# Patient Record
Sex: Female | Born: 1965 | Race: White | Hispanic: No | Marital: Married | Smoking: Never smoker
Health system: Southern US, Community
[De-identification: ages and names within clinical notes are randomized; demographics above are authoritative.]

## PROBLEM LIST (undated history)

## (undated) DIAGNOSIS — E119 Type 2 diabetes mellitus without complications: Secondary | ICD-10-CM

## (undated) DIAGNOSIS — R110 Nausea: Secondary | ICD-10-CM

## (undated) DIAGNOSIS — T7840XA Allergy, unspecified, initial encounter: Secondary | ICD-10-CM

## (undated) DIAGNOSIS — E785 Hyperlipidemia, unspecified: Secondary | ICD-10-CM

## (undated) HISTORY — PX: TONSILLECTOMY AND ADENOIDECTOMY: SUR1326

## (undated) HISTORY — DX: Nausea: R11.0

## (undated) HISTORY — DX: Allergy, unspecified, initial encounter: T78.40XA

## (undated) HISTORY — DX: Hyperlipidemia, unspecified: E78.5

## (undated) HISTORY — DX: Type 2 diabetes mellitus without complications: E11.9

---
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
## 1998-04-22 ENCOUNTER — Encounter: Admission: RE | Admit: 1998-04-22 | Discharge: 1998-07-21 | Payer: Self-pay | Admitting: Internal Medicine

## 1999-03-23 ENCOUNTER — Other Ambulatory Visit: Admission: RE | Admit: 1999-03-23 | Discharge: 1999-03-23 | Payer: Self-pay | Admitting: Gynecology

## 2000-01-18 ENCOUNTER — Encounter: Admission: RE | Admit: 2000-01-18 | Discharge: 2000-04-17 | Payer: Self-pay | Admitting: Internal Medicine

## 2000-03-01 ENCOUNTER — Encounter: Payer: Self-pay | Admitting: Emergency Medicine

## 2000-03-01 ENCOUNTER — Inpatient Hospital Stay (HOSPITAL_COMMUNITY): Admission: EM | Admit: 2000-03-01 | Discharge: 2000-03-02 | Payer: Self-pay

## 2000-03-01 ENCOUNTER — Encounter (INDEPENDENT_AMBULATORY_CARE_PROVIDER_SITE_OTHER): Payer: Self-pay | Admitting: *Deleted

## 2000-03-22 ENCOUNTER — Other Ambulatory Visit: Admission: RE | Admit: 2000-03-22 | Discharge: 2000-03-22 | Payer: Self-pay | Admitting: Gynecology

## 2001-03-23 ENCOUNTER — Other Ambulatory Visit: Admission: RE | Admit: 2001-03-23 | Discharge: 2001-03-23 | Payer: Self-pay | Admitting: Gynecology

## 2001-06-15 ENCOUNTER — Encounter: Payer: Self-pay | Admitting: Endocrinology

## 2001-06-15 ENCOUNTER — Encounter (INDEPENDENT_AMBULATORY_CARE_PROVIDER_SITE_OTHER): Payer: Self-pay | Admitting: *Deleted

## 2001-06-15 ENCOUNTER — Ambulatory Visit (HOSPITAL_COMMUNITY): Admission: RE | Admit: 2001-06-15 | Discharge: 2001-06-15 | Payer: Self-pay | Admitting: Endocrinology

## 2002-04-12 ENCOUNTER — Other Ambulatory Visit: Admission: RE | Admit: 2002-04-12 | Discharge: 2002-04-12 | Payer: Self-pay | Admitting: Gynecology

## 2003-04-17 ENCOUNTER — Other Ambulatory Visit: Admission: RE | Admit: 2003-04-17 | Discharge: 2003-04-17 | Payer: Self-pay | Admitting: Gynecology

## 2004-04-20 ENCOUNTER — Other Ambulatory Visit: Admission: RE | Admit: 2004-04-20 | Discharge: 2004-04-20 | Payer: Self-pay | Admitting: Gynecology

## 2004-05-19 ENCOUNTER — Encounter: Admission: RE | Admit: 2004-05-19 | Discharge: 2004-05-19 | Payer: Self-pay | Admitting: Gynecology

## 2004-08-05 ENCOUNTER — Emergency Department (HOSPITAL_COMMUNITY): Admission: EM | Admit: 2004-08-05 | Discharge: 2004-08-05 | Payer: Self-pay | Admitting: Emergency Medicine

## 2004-08-05 ENCOUNTER — Encounter (INDEPENDENT_AMBULATORY_CARE_PROVIDER_SITE_OTHER): Payer: Self-pay | Admitting: *Deleted

## 2005-04-27 ENCOUNTER — Other Ambulatory Visit: Admission: RE | Admit: 2005-04-27 | Discharge: 2005-04-27 | Payer: Self-pay | Admitting: Gynecology

## 2005-05-20 ENCOUNTER — Encounter: Admission: RE | Admit: 2005-05-20 | Discharge: 2005-05-20 | Payer: Self-pay | Admitting: Gynecology

## 2005-06-02 ENCOUNTER — Encounter (INDEPENDENT_AMBULATORY_CARE_PROVIDER_SITE_OTHER): Payer: Self-pay | Admitting: Specialist

## 2005-06-02 ENCOUNTER — Encounter: Admission: RE | Admit: 2005-06-02 | Discharge: 2005-06-02 | Payer: Self-pay | Admitting: Gynecology

## 2005-06-29 ENCOUNTER — Emergency Department (HOSPITAL_COMMUNITY): Admission: EM | Admit: 2005-06-29 | Discharge: 2005-06-29 | Payer: Self-pay | Admitting: Emergency Medicine

## 2005-09-30 ENCOUNTER — Ambulatory Visit: Payer: Self-pay | Admitting: Internal Medicine

## 2005-10-11 ENCOUNTER — Ambulatory Visit: Payer: Self-pay | Admitting: Gastroenterology

## 2005-11-01 ENCOUNTER — Ambulatory Visit: Payer: Self-pay | Admitting: Internal Medicine

## 2005-12-06 ENCOUNTER — Ambulatory Visit: Payer: Self-pay | Admitting: Internal Medicine

## 2006-05-02 ENCOUNTER — Other Ambulatory Visit: Admission: RE | Admit: 2006-05-02 | Discharge: 2006-05-02 | Payer: Self-pay | Admitting: Gynecology

## 2006-05-23 ENCOUNTER — Encounter: Admission: RE | Admit: 2006-05-23 | Discharge: 2006-05-23 | Payer: Self-pay | Admitting: Gynecology

## 2007-05-26 ENCOUNTER — Encounter: Admission: RE | Admit: 2007-05-26 | Discharge: 2007-05-26 | Payer: Self-pay | Admitting: Gynecology

## 2007-05-26 IMAGING — MG MM SCREEN MAMMOGRAM BILATERAL
5 series · 5 of 5 positions shown · non-contrast
Comparison: none

DG SCREEN MAMMOGRAM BILATERAL
Bilateral CC and MLO view(s) were taken.
Prior study comparison: [DATE], dG screen mammogram bilateral.

DIGITAL SCREENING MAMMOGRAM WITH CAD:
There are scattered fibroglandular densities.  No masses or malignant type calcifications are 
identified.  Compared with prior studies.

[R CC]
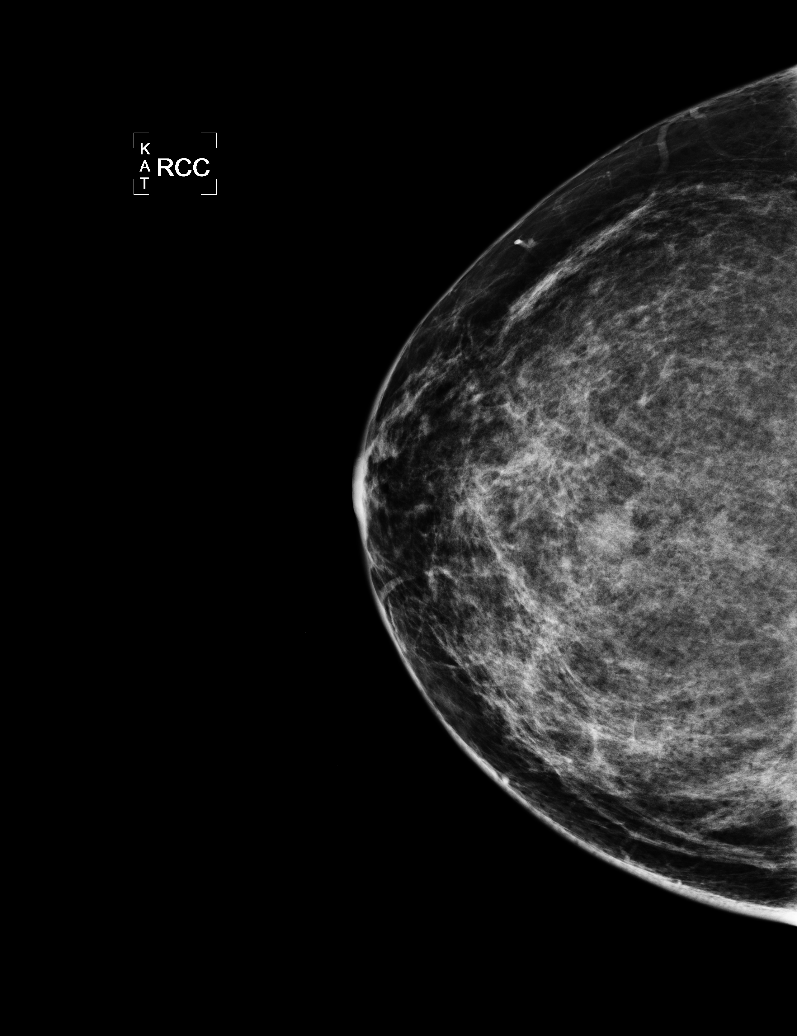

[L CC]
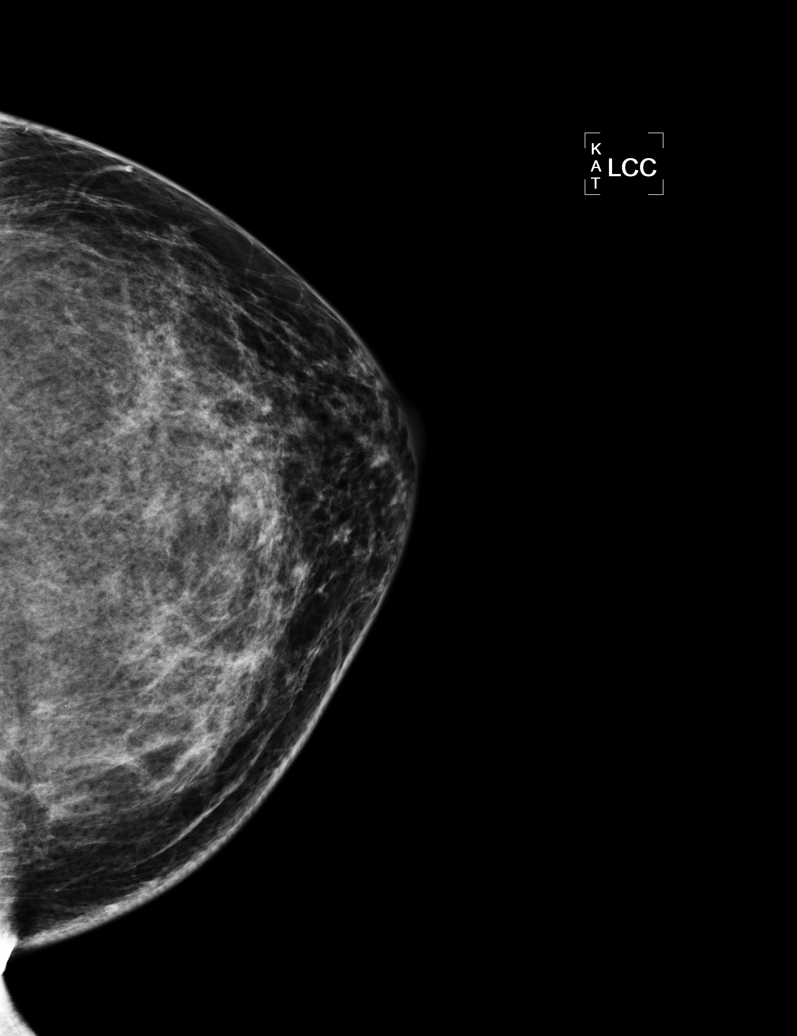

[L MLO]
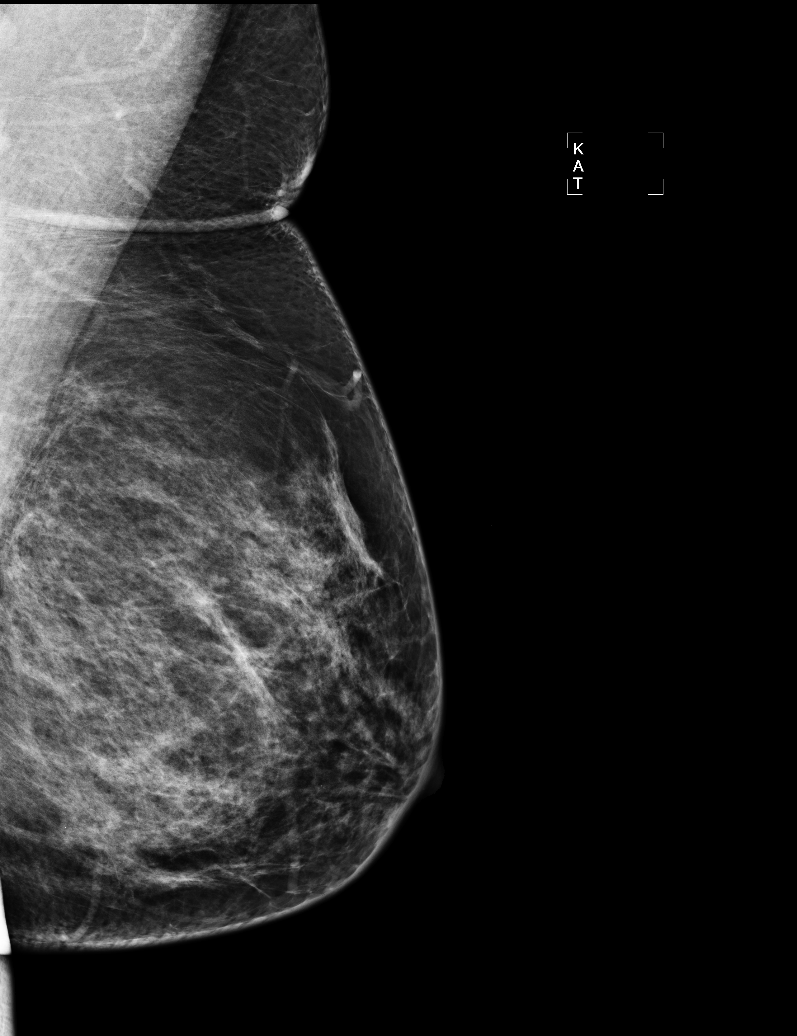

[R MLO (1 of 2)]
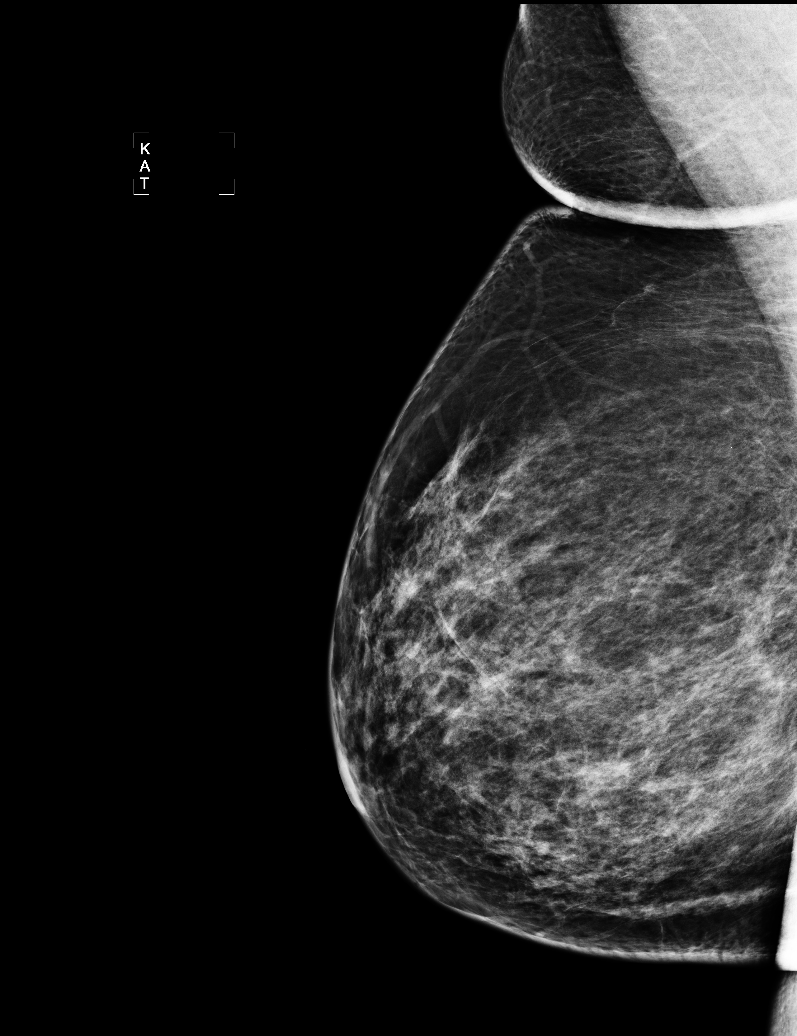

[R MLO (2 of 2)]
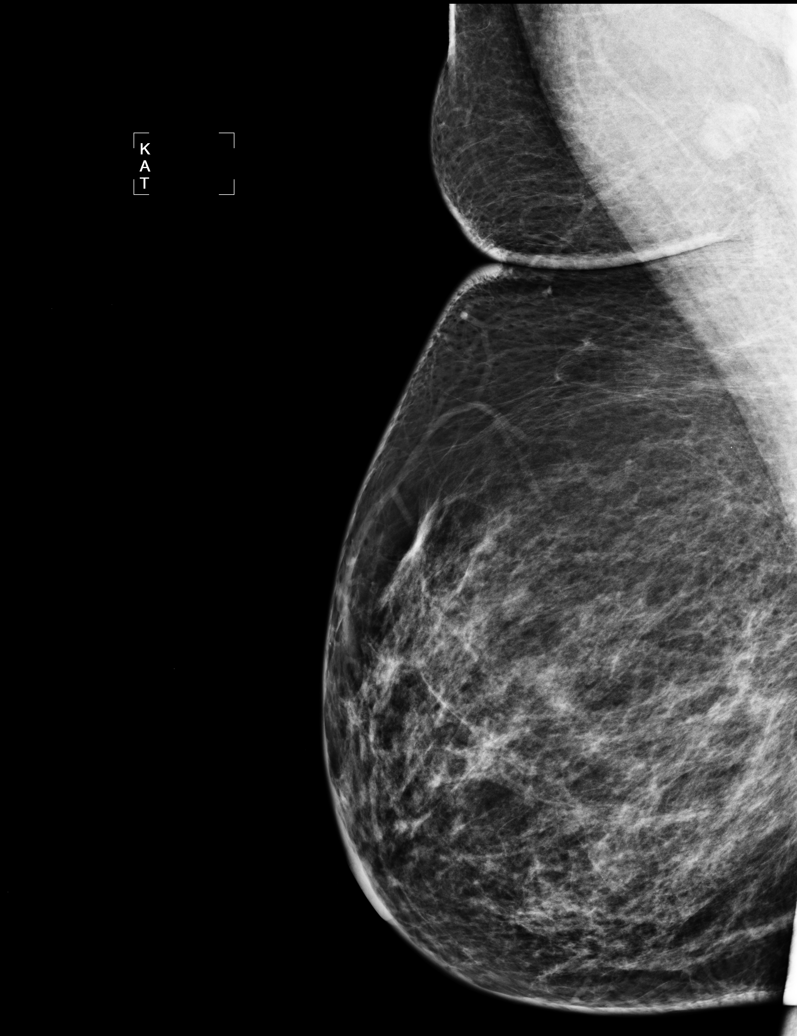

[5 of 5 positions shown; findings below may reference images not displayed]

IMPRESSION: No specific mammographic evidence of malignancy.  Next screening mammogram is recommended in one 
year.

ASSESSMENT: Negative - BI-RADS 1

Screening mammogram in 1 year.
ANALYZED BY COMPUTER AIDED DETECTION. , THIS PROCEDURE WAS A DIGITAL MAMMOGRAM.

## 2008-05-27 ENCOUNTER — Encounter: Admission: RE | Admit: 2008-05-27 | Discharge: 2008-05-27 | Payer: Self-pay | Admitting: Gynecology

## 2008-06-03 ENCOUNTER — Encounter: Admission: RE | Admit: 2008-06-03 | Discharge: 2008-06-03 | Payer: Self-pay | Admitting: Gynecology

## 2008-09-04 DIAGNOSIS — I10 Essential (primary) hypertension: Secondary | ICD-10-CM | POA: Insufficient documentation

## 2008-09-04 DIAGNOSIS — Z862 Personal history of diseases of the blood and blood-forming organs and certain disorders involving the immune mechanism: Secondary | ICD-10-CM

## 2008-09-04 DIAGNOSIS — E109 Type 1 diabetes mellitus without complications: Secondary | ICD-10-CM | POA: Insufficient documentation

## 2008-09-04 DIAGNOSIS — Z8639 Personal history of other endocrine, nutritional and metabolic disease: Secondary | ICD-10-CM

## 2008-09-04 DIAGNOSIS — E785 Hyperlipidemia, unspecified: Secondary | ICD-10-CM

## 2008-09-05 ENCOUNTER — Ambulatory Visit: Payer: Self-pay | Admitting: Internal Medicine

## 2008-09-05 DIAGNOSIS — R1013 Epigastric pain: Secondary | ICD-10-CM

## 2008-09-05 DIAGNOSIS — R740 Nonspecific elevation of levels of transaminase and lactic acid dehydrogenase [LDH]: Secondary | ICD-10-CM

## 2008-09-09 ENCOUNTER — Ambulatory Visit (HOSPITAL_COMMUNITY): Admission: RE | Admit: 2008-09-09 | Discharge: 2008-09-09 | Payer: Self-pay | Admitting: Internal Medicine

## 2009-05-28 ENCOUNTER — Encounter: Admission: RE | Admit: 2009-05-28 | Discharge: 2009-05-28 | Payer: Self-pay | Admitting: Gynecology

## 2010-06-01 ENCOUNTER — Encounter: Admission: RE | Admit: 2010-06-01 | Discharge: 2010-06-01 | Payer: Self-pay | Admitting: Gynecology

## 2010-07-25 ENCOUNTER — Encounter: Payer: Self-pay | Admitting: Gynecology

## 2010-09-15 ENCOUNTER — Other Ambulatory Visit: Payer: Self-pay | Admitting: Dermatology

## 2010-11-20 NOTE — Consult Note (Signed)
[facility]  Patient:    Erin Banks, Erin Banks                        MRN: [id number] Proc. Date: 03/01/00 Adm. Date:  [id number] Attending:  Meredith Leeds                          Consultation Report  CHIEF COMPLAINT:  Abdominal pain.  HISTORY OF PRESENT ILLNESS:  Erin Banks is a 45 year old female with type 1 diabetes mellitus, who developed right upper quadrant pain at approximately 8 oclock on February 29, 2000.  This was sharp and somewhat pleuritic in nature.  The pain was persistent but then had waves of increased severity of 5/10 in intensity.  The pain did not radiate.  She did have nausea without vomiting but denied any diarrhea; furthermore, she felt chilled and an oral temperature was taken and it was 99 degrees.  The pain persisted for several hours and the patient was instructed by me to present to the emergency department for further evaluation.  In the emergency department, she was found to have a white blood count of 18,000 with a left shift and right upper quadrant tenderness, as well as some mild elevation of her AST and ALT. Subsequently, an ultrasound was performed which did not show any definite pathology except some mild fatty liver changes and contracted gallbladder. She was subsequently admitted per general surgery for further evaluation for possible acute cholecystitis versus other abdominal process.  PAST MEDICAL HISTORY 1. Diabetes mellitus, type 1, since age 61. 2. Hypercholesterolemia. 3. G1, P1 parity status, status post C-section six years ago.  ALLERGIES:  PENICILLIN and SULFA DRUGS.  MEDICATIONS 1. Welchol 625 mg p.o. b.i.d. 2. Insulin pump, which is Humalog insulin with a carbohydrate correction    factor of 1 unit of Humalog per 10 g of carbohydrate and a correction of    blood sugar divided by 33 before each meal.  No other medications.  SOCIAL HISTORY:  Patient lives in [place].  She has been married for  11 years.  She denies tobacco or alcohol use.  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  FAMILY HISTORY:  Father is alive with diabetes.  Mother is alive and well. She has two sisters who are healthy.  REVIEW OF SYSTEMS:  As per the history of present illness; furthermore, the patient denies any dysuria, hematuria, hematochezia, melena or any cough or cold symptoms.  Her pump site has been fine and she denies any chest pains or shortness of breath.  PHYSICAL EXAMINATION  VITAL SIGNS:  Patient has been afebrile, temperature 97.9.  Pulse 89.  Blood pressure 109/67.  Blood sugars have been 246 and 142.  GENERAL:  Patient is lying supine in no acute distress, age appropriate, normal respirations.  HEENT:  Pupils are equally round and reactive to light.  There is no icterus. Oropharynx is clear.  NECK:  No JVD is noted.  Carotids are 2+ in intensity with no bruit.  LUNGS:  Clear to auscultation bilaterally with no wheezes, rales, or rhonchi.  HEART:  Regular with no murmur, rub or gallop and a normal S1 and S2.  ABDOMEN:  Soft, mildly obese, positive bowel sounds, nondistended.  There is no hepatosplenomegaly noted.  There is tenderness in the epigastric area and in the right lower quadrant.  There is no Murphys sign and there is  no definite convincing peritoneal signs, although the patient may have some slight rebound tenderness in the right lower quadrant.  She does not have any guarding.  Insulin pump site shows no signs of infection.  EXTREMITIES:  No clubbing, cyanosis, or edema.  NEUROLOGIC:  Alert and oriented x 4.  Appropriate.  Moves extremities x 4 with normal strength.  LABORATORY DATA AND IMAGING STUDIES:  White blood count is currently 9.6 with normal differential, hemoglobin 13.5, platelet count 230,000.  Urinalysis was essentially negative except for increased glucose.  Sodium is 139, potassium 4.3, chloride 106, CO2 26, BUN  6, creatinine 0.8, glucose 198.  LFTs are normal with the exception of an AST of 45 and an ALT of 71, which are mildly elevated.  A hepatobiliary scan showed normal gallbladder function with no evidence of cholecystitis and furthermore, a CT of the abdomen and pelvis is negative this afternoon.  Urine hCG is negative and amylase and lipase are both normal.  ASSESSMENT AND PLAN:  [age]-year-old female with type 1 diabetes and hypercholesterolemia with acute abdominal pain syndrome, with associated increased white blood count and mild elevation of liver function tests, with imaging studies only confirming some mild fatty liver changes.  1. Abdominal pain:  I appreciate surgical management of this patient.  I would    favor not restarting the patients Welchol currently.  Although this may    not be significantly likely to have caused any of the patients problems,    it would be reasonable to check her fasting lipid profile, as Welchol can    sometimes cause an increase in triglycerides which could relate to some    abdominal pathology, although the amylase and lipase are normal.  We will    also check a thyroid-stimulating hormone in the morning.  Will continue    Cipro currently, as the patient is seeming to have some clinical response    despite the unknown etiology of her pain. 2. Diabetes mellitus, type 1:  Will continue insulin pump at current doses.    If the patient tolerates her oral meal today, we will discontinue her D-5    half normal saline drip.  Will continue to follow the patient. DD:  03/01/00 TD:  03/02/00 Job: [id number]

## 2010-11-20 NOTE — H&P (Signed)
[facility]  Patient:    Erin Banks, Erin Banks                       MRN: [id number] Adm. Date:  03/01/00 Attending:  Zigmund Daniel, M.D.                         History and Physical  CHIEF COMPLAINT:  Abdominal pain.  HISTORY OF PRESENT ILLNESS:  The patient is a 45 year old white female who is a long-time diabetic on an insulin pump, who very shortly after dinner this evening began to experience epigastric pain.  It did not radiate.  It is a very uncomfortable, knife-like sensation.  It hurts to move around some as well.  She was nauseated and felt full but has not vomited.  It did not radiate to the back or to the right flank.  There has been no movement of the pain.  She came to the emergency department and was there found to have some tenderness in the epigastrium and white blood cell count elevated at 18,000 and normal hemoglobin, urinalysis unremarkable except for glucosuria, and chemistries unremarkable except for slight elevation of the SGOT and SGPT and elevation of her glucose.  She underwent an ultrasound of the abdomen, which showed a thickened gallbladder, but it is a contracted gallbladder and no stones were seen.  No fluid was seen around the gallbladder.  She was noted to have a fatty liver.  Pancreas and aorta were not well seen.  The patient is admitted for supportive care, further diagnostic workup, and possible cholecystectomy.  PAST MEDICAL HISTORY:  She is a diabetic, as mentioned.  She does not smoke or drink.  She has hyperlipidemia, on a lipid-reducing medicine which she could not name for me.  She is on an insulin pump, where she gets about 32 units of insulin a day with boluses before her meals.  She has not had any problems with her eyes, with her kidneys, or with heart or lungs to this point.  She has had a C-section and tonsillectomy.  She denies pregnancy.  ALLERGIES:  PENICILLIN and SULFA DRUGS.  FAMILY HISTORY:   Childhood illnesses, unremarkable.  REVIEW OF SYSTEMS:  Beyond the above, unremarkable.  PHYSICAL EXAMINATION:  VITAL SIGNS:  Temperature and vital signs per nursing report.  GENERAL:  Mental status is normal.  No acute distress.  HEENT:  Head, neck, eyes, ears, nose, mouth, and throat unremarkable.  CHEST:  Clear to auscultation.  BREASTS:  Normal.  HEART:  Heart rate a little rapid, rhythm normal, no murmur or gallop.  ABDOMEN:  Moderate tenderness in the epigastrium and just to the right of the midline.  No mass.  Bowel sounds are normal.  No rebound tenderness detected.  GENITAL/RECTAL:  Not performed.  EXTREMITIES:  Normal.  Pulses normal.  LYMPHATIC:  Lymph nodes not enlarged.  SKIN:  Normal.  IMPRESSION:  Acute abdominal process, probable acute cholecystitis, rule out gastritis or acute infection other than the above.  PLAN:  Admission for IV fluids, pain medicine, observation, further workup.  I will get a hepatobiliary scan, and I will get a medical consultation.  I will check the patient in a few hours. DD:  03/01/00 TD:  03/01/00 Job: [id number]

## 2011-04-28 ENCOUNTER — Other Ambulatory Visit: Payer: Self-pay | Admitting: Gynecology

## 2011-04-28 DIAGNOSIS — Z1231 Encounter for screening mammogram for malignant neoplasm of breast: Secondary | ICD-10-CM

## 2011-06-03 ENCOUNTER — Ambulatory Visit
Admission: RE | Admit: 2011-06-03 | Discharge: 2011-06-03 | Disposition: A | Discharge status: Home / Self Care | Payer: 59 | Source: Ambulatory Visit | Attending: Gynecology | Admitting: Gynecology

## 2011-06-03 DIAGNOSIS — Z1231 Encounter for screening mammogram for malignant neoplasm of breast: Secondary | ICD-10-CM

## 2011-06-15 ENCOUNTER — Other Ambulatory Visit: Payer: Self-pay | Admitting: Gynecology

## 2012-04-24 ENCOUNTER — Other Ambulatory Visit: Payer: Self-pay | Admitting: Gynecology

## 2012-04-24 DIAGNOSIS — Z1231 Encounter for screening mammogram for malignant neoplasm of breast: Secondary | ICD-10-CM

## 2012-06-05 ENCOUNTER — Ambulatory Visit
Admission: RE | Admit: 2012-06-05 | Discharge: 2012-06-05 | Disposition: A | Discharge status: Home / Self Care | Payer: 59 | Source: Ambulatory Visit | Attending: Gynecology | Admitting: Gynecology

## 2012-06-05 DIAGNOSIS — Z1231 Encounter for screening mammogram for malignant neoplasm of breast: Secondary | ICD-10-CM

## 2012-06-19 ENCOUNTER — Other Ambulatory Visit: Payer: Self-pay | Admitting: Gynecology

## 2013-05-08 ENCOUNTER — Other Ambulatory Visit: Payer: Self-pay

## 2013-05-08 DIAGNOSIS — Z1231 Encounter for screening mammogram for malignant neoplasm of breast: Secondary | ICD-10-CM

## 2013-06-07 ENCOUNTER — Ambulatory Visit
Admission: RE | Admit: 2013-06-07 | Discharge: 2013-06-07 | Disposition: A | Discharge status: Home / Self Care | Payer: 59 | Source: Ambulatory Visit

## 2013-06-07 DIAGNOSIS — Z1231 Encounter for screening mammogram for malignant neoplasm of breast: Secondary | ICD-10-CM

## 2014-05-10 ENCOUNTER — Other Ambulatory Visit: Payer: Self-pay

## 2014-05-10 DIAGNOSIS — Z1231 Encounter for screening mammogram for malignant neoplasm of breast: Secondary | ICD-10-CM

## 2014-06-11 ENCOUNTER — Encounter (INDEPENDENT_AMBULATORY_CARE_PROVIDER_SITE_OTHER): Payer: Self-pay

## 2014-06-11 ENCOUNTER — Ambulatory Visit
Admission: RE | Admit: 2014-06-11 | Discharge: 2014-06-11 | Disposition: A | Discharge status: Home / Self Care | Payer: 59 | Source: Ambulatory Visit

## 2014-06-11 DIAGNOSIS — Z1231 Encounter for screening mammogram for malignant neoplasm of breast: Secondary | ICD-10-CM

## 2015-05-07 ENCOUNTER — Other Ambulatory Visit: Payer: Self-pay

## 2015-05-07 DIAGNOSIS — Z1231 Encounter for screening mammogram for malignant neoplasm of breast: Secondary | ICD-10-CM

## 2015-06-16 ENCOUNTER — Ambulatory Visit
Admission: RE | Admit: 2015-06-16 | Discharge: 2015-06-16 | Disposition: A | Discharge status: Home / Self Care | Payer: 59 | Source: Ambulatory Visit

## 2015-06-16 DIAGNOSIS — Z1231 Encounter for screening mammogram for malignant neoplasm of breast: Secondary | ICD-10-CM

## 2015-06-18 ENCOUNTER — Other Ambulatory Visit: Payer: Self-pay | Admitting: Gynecology

## 2015-06-18 DIAGNOSIS — R928 Other abnormal and inconclusive findings on diagnostic imaging of breast: Secondary | ICD-10-CM

## 2015-06-24 ENCOUNTER — Ambulatory Visit
Admission: RE | Admit: 2015-06-24 | Discharge: 2015-06-24 | Disposition: A | Discharge status: Home / Self Care | Payer: 59 | Source: Ambulatory Visit | Attending: Gynecology | Admitting: Gynecology

## 2015-06-24 DIAGNOSIS — R928 Other abnormal and inconclusive findings on diagnostic imaging of breast: Secondary | ICD-10-CM

## 2016-05-18 ENCOUNTER — Other Ambulatory Visit: Payer: Self-pay | Admitting: Endocrinology

## 2016-05-18 DIAGNOSIS — Z1231 Encounter for screening mammogram for malignant neoplasm of breast: Secondary | ICD-10-CM

## 2016-07-02 ENCOUNTER — Ambulatory Visit
Admission: RE | Admit: 2016-07-02 | Discharge: 2016-07-02 | Disposition: A | Discharge status: Home / Self Care | Payer: 59 | Source: Ambulatory Visit | Attending: Endocrinology | Admitting: Endocrinology

## 2016-07-02 DIAGNOSIS — Z1231 Encounter for screening mammogram for malignant neoplasm of breast: Secondary | ICD-10-CM

## 2016-07-11 DIAGNOSIS — Z23 Encounter for immunization: Secondary | ICD-10-CM | POA: Diagnosis not present

## 2016-07-15 DIAGNOSIS — Z124 Encounter for screening for malignant neoplasm of cervix: Secondary | ICD-10-CM | POA: Diagnosis not present

## 2016-07-15 DIAGNOSIS — Z01419 Encounter for gynecological examination (general) (routine) without abnormal findings: Secondary | ICD-10-CM | POA: Diagnosis not present

## 2016-07-22 DIAGNOSIS — E109 Type 1 diabetes mellitus without complications: Secondary | ICD-10-CM | POA: Diagnosis not present

## 2016-08-06 DIAGNOSIS — I10 Essential (primary) hypertension: Secondary | ICD-10-CM | POA: Diagnosis not present

## 2016-08-06 DIAGNOSIS — Z4681 Encounter for fitting and adjustment of insulin pump: Secondary | ICD-10-CM | POA: Diagnosis not present

## 2016-08-06 DIAGNOSIS — E1065 Type 1 diabetes mellitus with hyperglycemia: Secondary | ICD-10-CM | POA: Diagnosis not present

## 2016-08-11 DIAGNOSIS — L570 Actinic keratosis: Secondary | ICD-10-CM | POA: Diagnosis not present

## 2016-08-11 DIAGNOSIS — D692 Other nonthrombocytopenic purpura: Secondary | ICD-10-CM | POA: Diagnosis not present

## 2016-08-11 DIAGNOSIS — Z85828 Personal history of other malignant neoplasm of skin: Secondary | ICD-10-CM | POA: Diagnosis not present

## 2016-08-11 DIAGNOSIS — D224 Melanocytic nevi of scalp and neck: Secondary | ICD-10-CM | POA: Diagnosis not present

## 2016-09-21 DIAGNOSIS — Z4681 Encounter for fitting and adjustment of insulin pump: Secondary | ICD-10-CM | POA: Diagnosis not present

## 2016-09-21 DIAGNOSIS — I10 Essential (primary) hypertension: Secondary | ICD-10-CM | POA: Diagnosis not present

## 2016-09-21 DIAGNOSIS — E1065 Type 1 diabetes mellitus with hyperglycemia: Secondary | ICD-10-CM | POA: Diagnosis not present

## 2016-10-13 DIAGNOSIS — E109 Type 1 diabetes mellitus without complications: Secondary | ICD-10-CM | POA: Diagnosis not present

## 2016-10-19 DIAGNOSIS — R740 Nonspecific elevation of levels of transaminase and lactic acid dehydrogenase [LDH]: Secondary | ICD-10-CM | POA: Diagnosis not present

## 2016-10-19 DIAGNOSIS — E784 Other hyperlipidemia: Secondary | ICD-10-CM | POA: Diagnosis not present

## 2016-10-19 DIAGNOSIS — E10319 Type 1 diabetes mellitus with unspecified diabetic retinopathy without macular edema: Secondary | ICD-10-CM | POA: Diagnosis not present

## 2016-11-25 DIAGNOSIS — L814 Other melanin hyperpigmentation: Secondary | ICD-10-CM | POA: Diagnosis not present

## 2016-11-25 DIAGNOSIS — L820 Inflamed seborrheic keratosis: Secondary | ICD-10-CM | POA: Diagnosis not present

## 2016-12-08 DIAGNOSIS — E10319 Type 1 diabetes mellitus with unspecified diabetic retinopathy without macular edema: Secondary | ICD-10-CM | POA: Diagnosis not present

## 2016-12-08 DIAGNOSIS — I10 Essential (primary) hypertension: Secondary | ICD-10-CM | POA: Diagnosis not present

## 2016-12-08 DIAGNOSIS — Z4681 Encounter for fitting and adjustment of insulin pump: Secondary | ICD-10-CM | POA: Diagnosis not present

## 2017-01-03 DIAGNOSIS — E109 Type 1 diabetes mellitus without complications: Secondary | ICD-10-CM | POA: Diagnosis not present

## 2017-02-22 DIAGNOSIS — R740 Nonspecific elevation of levels of transaminase and lactic acid dehydrogenase [LDH]: Secondary | ICD-10-CM | POA: Diagnosis not present

## 2017-02-22 DIAGNOSIS — E10319 Type 1 diabetes mellitus with unspecified diabetic retinopathy without macular edema: Secondary | ICD-10-CM | POA: Diagnosis not present

## 2017-02-22 DIAGNOSIS — E11319 Type 2 diabetes mellitus with unspecified diabetic retinopathy without macular edema: Secondary | ICD-10-CM | POA: Diagnosis not present

## 2017-03-28 DIAGNOSIS — E109 Type 1 diabetes mellitus without complications: Secondary | ICD-10-CM | POA: Diagnosis not present

## 2017-05-11 DIAGNOSIS — Z4681 Encounter for fitting and adjustment of insulin pump: Secondary | ICD-10-CM | POA: Diagnosis not present

## 2017-05-11 DIAGNOSIS — E10319 Type 1 diabetes mellitus with unspecified diabetic retinopathy without macular edema: Secondary | ICD-10-CM | POA: Diagnosis not present

## 2017-05-11 DIAGNOSIS — I10 Essential (primary) hypertension: Secondary | ICD-10-CM | POA: Diagnosis not present

## 2017-05-25 ENCOUNTER — Other Ambulatory Visit: Payer: Self-pay | Admitting: Endocrinology

## 2017-05-25 DIAGNOSIS — Z1231 Encounter for screening mammogram for malignant neoplasm of breast: Secondary | ICD-10-CM

## 2017-06-22 DIAGNOSIS — E109 Type 1 diabetes mellitus without complications: Secondary | ICD-10-CM | POA: Diagnosis not present

## 2017-07-04 ENCOUNTER — Ambulatory Visit
Admission: RE | Admit: 2017-07-04 | Discharge: 2017-07-04 | Disposition: A | Discharge status: Home / Self Care | Payer: 59 | Source: Ambulatory Visit | Attending: Endocrinology | Admitting: Endocrinology

## 2017-07-04 DIAGNOSIS — Z1231 Encounter for screening mammogram for malignant neoplasm of breast: Secondary | ICD-10-CM | POA: Diagnosis not present

## 2017-07-18 DIAGNOSIS — Z1389 Encounter for screening for other disorder: Secondary | ICD-10-CM | POA: Diagnosis not present

## 2017-07-18 DIAGNOSIS — E10319 Type 1 diabetes mellitus with unspecified diabetic retinopathy without macular edema: Secondary | ICD-10-CM | POA: Diagnosis not present

## 2017-07-18 DIAGNOSIS — I10 Essential (primary) hypertension: Secondary | ICD-10-CM | POA: Diagnosis not present

## 2017-07-18 DIAGNOSIS — E109 Type 1 diabetes mellitus without complications: Secondary | ICD-10-CM | POA: Diagnosis not present

## 2017-07-18 DIAGNOSIS — E7849 Other hyperlipidemia: Secondary | ICD-10-CM | POA: Diagnosis not present

## 2017-07-19 DIAGNOSIS — Z01419 Encounter for gynecological examination (general) (routine) without abnormal findings: Secondary | ICD-10-CM | POA: Diagnosis not present

## 2017-08-06 DIAGNOSIS — Z7984 Long term (current) use of oral hypoglycemic drugs: Secondary | ICD-10-CM | POA: Diagnosis not present

## 2017-08-06 DIAGNOSIS — E103293 Type 1 diabetes mellitus with mild nonproliferative diabetic retinopathy without macular edema, bilateral: Secondary | ICD-10-CM | POA: Diagnosis not present

## 2017-08-06 DIAGNOSIS — Z794 Long term (current) use of insulin: Secondary | ICD-10-CM | POA: Diagnosis not present

## 2017-10-05 DIAGNOSIS — E109 Type 1 diabetes mellitus without complications: Secondary | ICD-10-CM | POA: Diagnosis not present

## 2017-10-14 DIAGNOSIS — E109 Type 1 diabetes mellitus without complications: Secondary | ICD-10-CM | POA: Diagnosis not present

## 2017-10-19 DIAGNOSIS — E10319 Type 1 diabetes mellitus with unspecified diabetic retinopathy without macular edema: Secondary | ICD-10-CM | POA: Diagnosis not present

## 2017-10-19 DIAGNOSIS — J329 Chronic sinusitis, unspecified: Secondary | ICD-10-CM | POA: Diagnosis not present

## 2017-10-19 DIAGNOSIS — Z794 Long term (current) use of insulin: Secondary | ICD-10-CM | POA: Diagnosis not present

## 2017-11-22 DIAGNOSIS — E10319 Type 1 diabetes mellitus with unspecified diabetic retinopathy without macular edema: Secondary | ICD-10-CM | POA: Diagnosis not present

## 2017-11-22 DIAGNOSIS — I10 Essential (primary) hypertension: Secondary | ICD-10-CM | POA: Diagnosis not present

## 2017-11-22 DIAGNOSIS — E1065 Type 1 diabetes mellitus with hyperglycemia: Secondary | ICD-10-CM | POA: Diagnosis not present

## 2017-11-29 ENCOUNTER — Encounter: Payer: Self-pay | Admitting: Internal Medicine

## 2017-12-26 DIAGNOSIS — E109 Type 1 diabetes mellitus without complications: Secondary | ICD-10-CM | POA: Diagnosis not present

## 2018-01-04 DIAGNOSIS — E109 Type 1 diabetes mellitus without complications: Secondary | ICD-10-CM | POA: Diagnosis not present

## 2018-01-18 DIAGNOSIS — E10319 Type 1 diabetes mellitus with unspecified diabetic retinopathy without macular edema: Secondary | ICD-10-CM | POA: Diagnosis not present

## 2018-01-18 DIAGNOSIS — Z4681 Encounter for fitting and adjustment of insulin pump: Secondary | ICD-10-CM | POA: Diagnosis not present

## 2018-01-18 DIAGNOSIS — Z794 Long term (current) use of insulin: Secondary | ICD-10-CM | POA: Diagnosis not present

## 2018-02-10 ENCOUNTER — Telehealth: Payer: Self-pay

## 2018-02-10 ENCOUNTER — Ambulatory Visit (AMBULATORY_SURGERY_CENTER): Payer: Self-pay

## 2018-02-10 VITALS — Ht 64.5 in | Wt 193.4 lb

## 2018-02-10 DIAGNOSIS — Z800 Family history of malignant neoplasm of digestive organs: Secondary | ICD-10-CM

## 2018-02-10 MED ORDER — NA SULFATE-K SULFATE-MG SULF 17.5-3.13-1.6 GM/177ML PO SOLN: 1.0000 | Freq: Once | ORAL | 0 refills | Status: AC

## 2018-02-10 NOTE — Telephone Encounter (Signed)
Erin Banks, This pt is scheduled for her colon with Dr Henrene Pastor on 02/24/18. The patient states she has an insulin pump which  regulates her Humalog insulin. Dr Oren Section is her doctor who manages her insulin pump. Please send a letter to the MD for proper instructions for the insulin pump. Thanks, [name]

## 2018-02-10 NOTE — Progress Notes (Signed)
Per pt, no allergies to soy or egg products.Pt not taking any weight loss meds or using  O2 at home.  Pt refused emmi video.  During the PV, pt states she is has an insulin pump and Dr Oren Section manages the medication. Will send a message to Magda Paganini, Dr Henrene Pastor 's CMA  to contact the MD for proper instructions.  Pt was advised to call our office regarding the instructions on the insulin pump, if no one contacts her. Pt understood. [name]

## 2018-02-13 ENCOUNTER — Encounter: Payer: Self-pay | Admitting: Internal Medicine

## 2018-02-14 NOTE — Telephone Encounter (Signed)
Ln on vm for patient to call back with the name of the practice where her insulin pump doctor works so I can send a letter regarding her procedure.

## 2018-02-16 NOTE — Telephone Encounter (Signed)
Sent insulin pump letter to [employer] at [facility]

## 2018-02-17 NOTE — Telephone Encounter (Signed)
Spoke to patient and relayed Dr. Ammie Ferrier instructions for insulin pump: If blood sugar is less than 110 the morning of the procedure, use temporary basal of 75% for 4 hours.  Only bolus for correction if greater than 200 while NPO.  Treat appropriately as needed for blood sugar less than 70.  Patient understood

## 2018-02-24 ENCOUNTER — Encounter: Payer: Self-pay | Admitting: Internal Medicine

## 2018-02-24 ENCOUNTER — Ambulatory Visit (AMBULATORY_SURGERY_CENTER): Payer: 59 | Admitting: Internal Medicine

## 2018-02-24 VITALS — BP 112/64 | HR 73 | Temp 98.0°F | Resp 23 | Ht 64.0 in | Wt 193.0 lb

## 2018-02-24 DIAGNOSIS — Z800 Family history of malignant neoplasm of digestive organs: Secondary | ICD-10-CM

## 2018-02-24 DIAGNOSIS — D120 Benign neoplasm of cecum: Secondary | ICD-10-CM | POA: Diagnosis not present

## 2018-02-24 DIAGNOSIS — Z1211 Encounter for screening for malignant neoplasm of colon: Secondary | ICD-10-CM | POA: Diagnosis present

## 2018-02-24 MED ORDER — SODIUM CHLORIDE 0.9 % IV SOLN: 500.0000 mL | Freq: Once | INTRAVENOUS | Status: DC

## 2018-02-24 NOTE — Progress Notes (Signed)
Alert and oriented x3, pleased with MAC, report to RN  

## 2018-02-24 NOTE — Op Note (Signed)
[facility] Patient Name: Erin Banks Procedure Date: 02/24/2018 8:11 AM MRN: [id number] Endoscopist: Docia Chuck. Henrene Pastor , MD Age: 52 Referring MD:  Date of Birth: December 18, 1965 Gender: Female Account #: [id number] Procedure:                Colonoscopy, with cold snare x 1 Indications:              Screening in patient at increased risk: Colorectal                            cancer in father before age 56 Medicines:                Monitored Anesthesia Care Procedure:                Pre-Anesthesia Assessment:                           - Prior to the procedure, a History and Physical                            was performed, and patient medications and                            allergies were reviewed. The patient's tolerance of                            previous anesthesia was also reviewed. The risks                            and benefits of the procedure and the sedation                            options and risks were discussed with the patient.                            All questions were answered, and informed consent                            was obtained. Prior Anticoagulants: The patient has                            taken no previous anticoagulant or antiplatelet                            agents. ASA Grade Assessment: III - A patient with                            severe systemic disease. After reviewing the risks                            and benefits, the patient was deemed in                            satisfactory condition to undergo the procedure.  After obtaining informed consent, the colonoscope                            was passed under direct vision. Throughout the                            procedure, the patient's blood pressure, pulse, and                            oxygen saturations were monitored continuously. The                            Colonoscope was introduced through the anus and                            advanced  to the the cecum, identified by                            appendiceal orifice and ileocecal valve. The                            ileocecal valve, appendiceal orifice, and rectum                            were photographed. The quality of the bowel                            preparation was excellent. The colonoscopy was                            performed without difficulty. The patient tolerated                            the procedure well. The bowel preparation used was                            SUPREP. Scope In: 8:21:00 AM Scope Out: 8:40:32 AM Scope Withdrawal Time: 0 hours 14 minutes 41 seconds  Total Procedure Duration: 0 hours 19 minutes 32 seconds  Findings:                 A 3 mm polyp was found in the cecum. The polyp was                            removed with a cold snare. Resection and retrieval                            were complete.                           The exam was otherwise without abnormality on                            direct and retroflexion views. Complications:  No immediate complications. Estimated blood loss:                            None. Estimated Blood Loss:     Estimated blood loss: none. Impression:               - One 3 mm polyp in the cecum. Removed.                           - The examination was otherwise normal on direct                            and retroflexion views.                           - No specimens collected. Recommendation:           - Repeat colonoscopy in 5 years for surveillance.                           - Patient has a contact number available for                            emergencies. The signs and symptoms of potential                            delayed complications were discussed with the                            patient. Return to normal activities tomorrow.                            Written discharge instructions were provided to the                            patient.                           -  Resume previous diet.                           - Continue present medications.                           - Await pathology results. Docia Chuck. Henrene Pastor, MD 02/24/2018 8:49:51 AM This report has been signed electronically.

## 2018-02-24 NOTE — Progress Notes (Signed)
Called to room to assist during endoscopic procedure.  Patient ID and intended procedure confirmed with present staff. Received instructions for my participation in the procedure from the performing physician.  

## 2018-02-24 NOTE — Patient Instructions (Addendum)
Thank you for allowing [place] to care for you today!  Handout given on polyps  Pathology results by mail 1-2 weeks.  Next colonoscopy in 5 years for surveillance.    YOU HAD AN ENDOSCOPIC PROCEDURE TODAY AT [facility]:   Refer to the procedure report that was given to you for any specific questions about what was found during the examination.  If the procedure report does not answer your questions, please call your gastroenterologist to clarify.  If you requested that your care partner not be given the details of your procedure findings, then the procedure report has been included in a sealed envelope for you to review at your convenience later.  YOU SHOULD EXPECT: Some feelings of bloating in the abdomen. Passage of more gas than usual.  Walking can help get rid of the air that was put into your GI tract during the procedure and reduce the bloating. If you had a lower endoscopy (such as a colonoscopy or flexible sigmoidoscopy) you may notice spotting of blood in your stool or on the toilet paper. If you underwent a bowel prep for your procedure, you may not have a normal bowel movement for a few days.  Please Note:  You might notice some irritation and congestion in your nose or some drainage.  This is from the oxygen used during your procedure.  There is no need for concern and it should clear up in a day or so.  SYMPTOMS TO REPORT IMMEDIATELY:   Following lower endoscopy (colonoscopy or flexible sigmoidoscopy):  Excessive amounts of blood in the stool  Significant tenderness or worsening of abdominal pains  Swelling of the abdomen that is new, acute  Fever of 100F or higher    For urgent or emergent issues, a gastroenterologist can be reached at any hour by calling [phone].   DIET:  We do recommend a small meal at first, but then you may proceed to your regular diet.  Drink plenty of fluids but you should avoid alcoholic beverages for 24 hours.  ACTIVITY:  You  should plan to take it easy for the rest of today and you should NOT DRIVE or use heavy machinery until tomorrow (because of the sedation medicines used during the test).    FOLLOW UP: Our staff will call the number listed on your records the next business day following your procedure to check on you and address any questions or concerns that you may have regarding the information given to you following your procedure. If we do not reach you, we will leave a message.  However, if you are feeling well and you are not experiencing any problems, there is no need to return our call.  We will assume that you have returned to your regular daily activities without incident.  If any biopsies were taken you will be contacted by phone or by letter within the next 1-3 weeks.  Please call us at [phone] if you have not heard about the biopsies in 3 weeks.    SIGNATURES/CONFIDENTIALITY: You and/or your care partner have signed paperwork which will be entered into your electronic medical record.  These signatures attest to the fact that that the information above on your After Visit Summary has been reviewed and is understood.  Full responsibility of the confidentiality of this discharge information lies with you and/or your care-partner.

## 2018-02-27 ENCOUNTER — Telehealth: Payer: Self-pay

## 2018-02-27 NOTE — Telephone Encounter (Signed)
  Follow up Call-  Call back number 02/24/2018  Post procedure Call Back phone  # [phone]  Permission to leave phone message Yes  Some recent data might be hidden     Patient questions:  Do you have a fever, pain , or abdominal swelling? No. Pain Score  0 *  Have you tolerated food without any problems? Yes.    Have you been able to return to your normal activities? Yes.    Do you have any questions about your discharge instructions: Diet   No. Medications  No. Follow up visit  No.  Do you have questions or concerns about your Care? No.  Actions: * If pain score is 4 or above: No action needed, pain <4.  Patient questioned when she will have a bowel movement, advised that if she is not uncomfortable and is passing gas, it will happen soon.  She agreed.

## 2018-03-01 ENCOUNTER — Encounter: Payer: Self-pay | Admitting: Internal Medicine

## 2018-03-17 DIAGNOSIS — E109 Type 1 diabetes mellitus without complications: Secondary | ICD-10-CM | POA: Diagnosis not present

## 2018-03-27 DIAGNOSIS — E109 Type 1 diabetes mellitus without complications: Secondary | ICD-10-CM | POA: Diagnosis not present

## 2018-04-03 ENCOUNTER — Other Ambulatory Visit: Payer: Self-pay | Admitting: Endocrinology

## 2018-04-03 DIAGNOSIS — Z1231 Encounter for screening mammogram for malignant neoplasm of breast: Secondary | ICD-10-CM

## 2018-04-07 DIAGNOSIS — D0439 Carcinoma in situ of skin of other parts of face: Secondary | ICD-10-CM | POA: Diagnosis not present

## 2018-04-07 DIAGNOSIS — D224 Melanocytic nevi of scalp and neck: Secondary | ICD-10-CM | POA: Diagnosis not present

## 2018-04-07 DIAGNOSIS — D2271 Melanocytic nevi of right lower limb, including hip: Secondary | ICD-10-CM | POA: Diagnosis not present

## 2018-04-07 DIAGNOSIS — C44529 Squamous cell carcinoma of skin of other part of trunk: Secondary | ICD-10-CM | POA: Diagnosis not present

## 2018-04-07 DIAGNOSIS — Z85828 Personal history of other malignant neoplasm of skin: Secondary | ICD-10-CM | POA: Diagnosis not present

## 2018-04-07 DIAGNOSIS — L570 Actinic keratosis: Secondary | ICD-10-CM | POA: Diagnosis not present

## 2018-04-07 DIAGNOSIS — C44321 Squamous cell carcinoma of skin of nose: Secondary | ICD-10-CM | POA: Diagnosis not present

## 2018-04-19 DIAGNOSIS — I10 Essential (primary) hypertension: Secondary | ICD-10-CM | POA: Diagnosis not present

## 2018-04-19 DIAGNOSIS — E10319 Type 1 diabetes mellitus with unspecified diabetic retinopathy without macular edema: Secondary | ICD-10-CM | POA: Diagnosis not present

## 2018-04-19 DIAGNOSIS — E7849 Other hyperlipidemia: Secondary | ICD-10-CM | POA: Diagnosis not present

## 2018-05-08 DIAGNOSIS — I10 Essential (primary) hypertension: Secondary | ICD-10-CM | POA: Diagnosis not present

## 2018-05-08 DIAGNOSIS — J329 Chronic sinusitis, unspecified: Secondary | ICD-10-CM | POA: Diagnosis not present

## 2018-05-08 DIAGNOSIS — Z6831 Body mass index (BMI) 31.0-31.9, adult: Secondary | ICD-10-CM | POA: Diagnosis not present

## 2018-05-22 DIAGNOSIS — J329 Chronic sinusitis, unspecified: Secondary | ICD-10-CM | POA: Diagnosis not present

## 2018-05-22 DIAGNOSIS — C44321 Squamous cell carcinoma of skin of nose: Secondary | ICD-10-CM | POA: Diagnosis not present

## 2018-05-22 DIAGNOSIS — Z974 Presence of external hearing-aid: Secondary | ICD-10-CM | POA: Diagnosis not present

## 2018-05-22 DIAGNOSIS — J343 Hypertrophy of nasal turbinates: Secondary | ICD-10-CM | POA: Diagnosis not present

## 2018-05-30 DIAGNOSIS — Z794 Long term (current) use of insulin: Secondary | ICD-10-CM | POA: Diagnosis not present

## 2018-05-30 DIAGNOSIS — E10319 Type 1 diabetes mellitus with unspecified diabetic retinopathy without macular edema: Secondary | ICD-10-CM | POA: Diagnosis not present

## 2018-05-30 DIAGNOSIS — Z4681 Encounter for fitting and adjustment of insulin pump: Secondary | ICD-10-CM | POA: Diagnosis not present

## 2018-06-07 DIAGNOSIS — E109 Type 1 diabetes mellitus without complications: Secondary | ICD-10-CM | POA: Diagnosis not present

## 2018-06-10 DIAGNOSIS — Z23 Encounter for immunization: Secondary | ICD-10-CM | POA: Diagnosis not present

## 2018-06-16 DIAGNOSIS — E109 Type 1 diabetes mellitus without complications: Secondary | ICD-10-CM | POA: Diagnosis not present

## 2018-07-06 ENCOUNTER — Ambulatory Visit: Payer: 59

## 2018-07-25 DIAGNOSIS — Z01419 Encounter for gynecological examination (general) (routine) without abnormal findings: Secondary | ICD-10-CM | POA: Diagnosis not present

## 2018-07-25 DIAGNOSIS — N951 Menopausal and female climacteric states: Secondary | ICD-10-CM | POA: Diagnosis not present

## 2018-08-04 ENCOUNTER — Ambulatory Visit
Admission: RE | Admit: 2018-08-04 | Discharge: 2018-08-04 | Disposition: A | Discharge status: Home / Self Care | Payer: 59 | Source: Ambulatory Visit | Attending: Endocrinology | Admitting: Endocrinology

## 2018-08-04 DIAGNOSIS — Z1231 Encounter for screening mammogram for malignant neoplasm of breast: Secondary | ICD-10-CM

## 2018-08-09 DIAGNOSIS — E1065 Type 1 diabetes mellitus with hyperglycemia: Secondary | ICD-10-CM | POA: Diagnosis not present

## 2018-08-09 DIAGNOSIS — E10319 Type 1 diabetes mellitus with unspecified diabetic retinopathy without macular edema: Secondary | ICD-10-CM | POA: Diagnosis not present

## 2018-08-09 DIAGNOSIS — Z4681 Encounter for fitting and adjustment of insulin pump: Secondary | ICD-10-CM | POA: Diagnosis not present

## 2018-08-09 DIAGNOSIS — Z794 Long term (current) use of insulin: Secondary | ICD-10-CM | POA: Diagnosis not present

## 2018-09-04 DIAGNOSIS — E10319 Type 1 diabetes mellitus with unspecified diabetic retinopathy without macular edema: Secondary | ICD-10-CM | POA: Diagnosis not present

## 2018-09-04 DIAGNOSIS — D126 Benign neoplasm of colon, unspecified: Secondary | ICD-10-CM | POA: Diagnosis not present

## 2018-09-04 DIAGNOSIS — N183 Chronic kidney disease, stage 3 (moderate): Secondary | ICD-10-CM | POA: Diagnosis not present

## 2018-09-04 DIAGNOSIS — E7849 Other hyperlipidemia: Secondary | ICD-10-CM | POA: Diagnosis not present

## 2018-09-04 DIAGNOSIS — E1065 Type 1 diabetes mellitus with hyperglycemia: Secondary | ICD-10-CM | POA: Diagnosis not present

## 2019-04-16 ENCOUNTER — Other Ambulatory Visit: Payer: Self-pay | Admitting: Endocrinology

## 2019-04-16 DIAGNOSIS — Z1231 Encounter for screening mammogram for malignant neoplasm of breast: Secondary | ICD-10-CM

## 2019-08-06 ENCOUNTER — Ambulatory Visit
Admission: RE | Admit: 2019-08-06 | Discharge: 2019-08-06 | Disposition: A | Discharge status: Home / Self Care | Payer: 59 | Source: Ambulatory Visit | Attending: Endocrinology | Admitting: Endocrinology

## 2019-08-06 ENCOUNTER — Other Ambulatory Visit: Payer: Self-pay

## 2019-08-06 DIAGNOSIS — Z1231 Encounter for screening mammogram for malignant neoplasm of breast: Secondary | ICD-10-CM

## 2020-01-21 ENCOUNTER — Ambulatory Visit: Payer: Self-pay | Admitting: Family Medicine

## 2020-07-10 ENCOUNTER — Other Ambulatory Visit: Payer: Self-pay | Admitting: Endocrinology

## 2020-07-10 DIAGNOSIS — Z1231 Encounter for screening mammogram for malignant neoplasm of breast: Secondary | ICD-10-CM

## 2020-08-20 ENCOUNTER — Other Ambulatory Visit: Payer: Self-pay

## 2020-08-20 ENCOUNTER — Ambulatory Visit
Admission: RE | Admit: 2020-08-20 | Discharge: 2020-08-20 | Disposition: A | Discharge status: Home / Self Care | Payer: 59 | Source: Ambulatory Visit | Attending: Endocrinology | Admitting: Endocrinology

## 2020-08-20 DIAGNOSIS — Z1231 Encounter for screening mammogram for malignant neoplasm of breast: Secondary | ICD-10-CM

## 2021-06-08 ENCOUNTER — Other Ambulatory Visit: Payer: Self-pay | Admitting: Endocrinology

## 2021-06-08 DIAGNOSIS — Z1231 Encounter for screening mammogram for malignant neoplasm of breast: Secondary | ICD-10-CM

## 2021-08-20 ENCOUNTER — Ambulatory Visit: Payer: 59

## 2021-08-21 ENCOUNTER — Other Ambulatory Visit: Payer: Self-pay

## 2021-08-21 ENCOUNTER — Ambulatory Visit
Admission: RE | Admit: 2021-08-21 | Discharge: 2021-08-21 | Disposition: A | Discharge status: Home / Self Care | Payer: 59 | Source: Ambulatory Visit | Attending: Endocrinology | Admitting: Endocrinology

## 2021-08-21 DIAGNOSIS — Z1231 Encounter for screening mammogram for malignant neoplasm of breast: Secondary | ICD-10-CM

## 2022-06-21 ENCOUNTER — Ambulatory Visit: Payer: 59 | Admitting: Physical Therapy

## 2022-06-24 ENCOUNTER — Encounter: Payer: 59 | Admitting: Physical Therapy

## 2022-07-01 ENCOUNTER — Encounter: Payer: Self-pay | Admitting: Physical Therapy

## 2022-07-01 ENCOUNTER — Ambulatory Visit: Payer: 59 | Attending: Family Medicine | Admitting: Physical Therapy

## 2022-07-01 ENCOUNTER — Other Ambulatory Visit: Payer: Self-pay

## 2022-07-01 DIAGNOSIS — M25561 Pain in right knee: Secondary | ICD-10-CM | POA: Diagnosis present

## 2022-07-01 DIAGNOSIS — R600 Localized edema: Secondary | ICD-10-CM | POA: Insufficient documentation

## 2022-07-01 NOTE — Therapy (Signed)
OUTPATIENT PHYSICAL THERAPY LOWER EXTREMITY EVALUATION   Patient Name: Erin Banks MRN: [id number] DOB:22-Nov-1965, 56 y.o., female Today's Date: 07/01/2022    PT End of Session - 07/01/22 1148     Visit Number 1    Number of Visits 12    Date for PT Re-Evaluation 07/29/22    Authorization Type FOTO    PT Start Time 1112    PT Stop Time 1203    PT Time Calculation (min) 51 min    Activity Tolerance Patient tolerated treatment well    Behavior During Therapy WFL for tasks assessed/performed             Past Medical History:  Diagnosis Date   Allergy    seasonal   Diabetes mellitus without complication ([place])    Hyperlipidemia    Nausea    due to gallbladder   Past Surgical History:  Procedure Laterality Date   CESAREAN SECTION      one c-section   TONSILLECTOMY AND ADENOIDECTOMY     56 years old   [place] Bilateral 1986   Patient Active Problem List   Diagnosis Date Noted   ABDOMINAL PAIN-EPIGASTRIC 09/05/2008   ABNORMAL TRANSAMINASE-LFT'S 09/05/2008   IDDM 09/04/2008   HYPERLIPIDEMIA 09/04/2008   HYPERTENSION 09/04/2008   LIVER FUNCTION TESTS, ABNORMAL, HX OF 09/04/2008    REFERRING PROVIDER: Nuala Alpha DO  REFERRING DIAG: Pain in right knee.  THERAPY DIAG:  Acute pain of right knee - Plan: PT plan of care cert/re-cert  Localized edema - Plan: PT plan of care cert/re-cert  Rationale for Evaluation and Treatment: Rehabilitation  ONSET DATE: About three weeks ago.  SUBJECTIVE:   SUBJECTIVE STATEMENT: The patient presents to the clinic with c/o right knee pain that came on about three weeks ago while playing [employer].  She states that by the next day the pain was so bad she could hardly walk.  She has been very active lately so her pain has come down some but is still rated at a 6/10 today.  Rest decreases pain and increased up time and activity increase pain.    PERTINENT HISTORY: DM, h/o left knee pain. PAIN:  Are you  having pain? Yes: NPRS scale: 6/10 Pain location: Right knee. Pain description: Ache Aggravating factors: As above. Relieving factors: As above.  PRECAUTIONS: Other: Pain-free there ex.  WEIGHT BEARING RESTRICTIONS: No  FALLS:  Has patient fallen in last 6 months? No  LIVING ENVIRONMENT: Lives with: lives with their spouse Lives in: House/apartment Stairs: Yes: External: 2 steps;   Has following equipment at home: None  OCCUPATION: Retired.  PLOF: Independent  PATIENT GOALS: return to Pickle ball.  Play Golf.  NEXT MD VISIT:   OBJECTIVE:    PATIENT SURVEYS:  FOTO Complete.   EDEMA:  Circumferential: RT 2 cms > LT.    PALPATION: Tender to palpation over patient's right knee medial joint line.  LOWER EXTREMITY ROM: Right knee exhibits full extension and flexion to 123 degrees (left knee flexion is 132 degrees).  LOWER EXTREMITY MMT:  Normal right knee strength.  LOWER EXTREMITY SPECIAL TESTS:  Normal right knee stability.  Some pain increase with Valgus testing.    GAIT: Mild gait antalgia observed.   TODAY'S TREATMENT:  DATE: IFC at 80-150 Hz on 40% scan x 20 minutes to patient's right medial knee region with vasopneumatic on low.  Patient tolerated treatment without complaint with normal modality response following removal of modality.  ASSESSMENT:  CLINICAL IMPRESSION: The patient presents to OPPT with c/o right knee pain after playing Pickle ball about three weeks ago.  She has a minimal flexion deficit per contralateral comparison.  Edema is present.  She has normal right knee strength.  Her knee is stable.  She is tender to palpation over her right knee medial joint line region.  Patient will benefit from skilled physical therapy intervention to address pain and deficits.  OBJECTIVE IMPAIRMENTS: Abnormal gait, decreased  activity tolerance, decreased ROM, increased edema, and pain.   ACTIVITY LIMITATIONS: squatting and locomotion level  PARTICIPATION LIMITATIONS:  Recreational activities.  REHAB POTENTIAL: Excellent  CLINICAL DECISION MAKING: Stable/uncomplicated  EVALUATION COMPLEXITY: Low   GOALS:  LONG TERM GOALS: Target date: 07/29/22.  Ind with a HEP. Goal status: INITIAL  2.  Perform ADL's with pain not > 2-3/10. Goal status: INITIAL  3.  Return to recreational activities. Goal status: INITIAL PLAN:  PT FREQUENCY: 3x/week  PT DURATION: 4 weeks  PLANNED INTERVENTIONS: Therapeutic exercises, Therapeutic activity, Gait training, Patient/Family education, Self Care, Dry Needling, Electrical stimulation, Cryotherapy, Moist heat, Vasopneumatic device, Ultrasound, Ionotophoresis '4mg'$ /ml Dexamethasone, and Manual therapy  PLAN FOR NEXT SESSION: Modalities as needed.  Pain-free right knee there ex (O and CKC).   Jennilyn Esteve, [place], PT 07/01/2022, 12:28 PM

## 2022-07-08 ENCOUNTER — Ambulatory Visit: Payer: 59 | Attending: Family Medicine | Admitting: Physical Therapy

## 2022-07-08 ENCOUNTER — Encounter: Payer: Self-pay | Admitting: Physical Therapy

## 2022-07-08 DIAGNOSIS — R600 Localized edema: Secondary | ICD-10-CM | POA: Diagnosis present

## 2022-07-08 DIAGNOSIS — M25561 Pain in right knee: Secondary | ICD-10-CM | POA: Insufficient documentation

## 2022-07-08 NOTE — Therapy (Signed)
OUTPATIENT PHYSICAL THERAPY LOWER EXTREMITY EVALUATION   Patient Name: Erin Banks MRN: [id number] DOB:02-03-1966, 57 y.o., female Today's Date: 07/08/2022    PT End of Session - 07/08/22 1325     Visit Number 2    Number of Visits 12    Date for PT Re-Evaluation 07/29/22    Authorization Type FOTO    PT Start Time 1257    Activity Tolerance Patient tolerated treatment well    Behavior During Therapy [facility] for tasks assessed/performed             Past Medical History:  Diagnosis Date   Allergy    seasonal   Diabetes mellitus without complication ([place])    Hyperlipidemia    Nausea    due to gallbladder   Past Surgical History:  Procedure Laterality Date   CESAREAN SECTION      one c-section   TONSILLECTOMY AND ADENOIDECTOMY     57 years old   [place] EXTRACTION Bilateral 1986   Patient Active Problem List   Diagnosis Date Noted   ABDOMINAL PAIN-EPIGASTRIC 09/05/2008   ABNORMAL TRANSAMINASE-LFT'S 09/05/2008   IDDM 09/04/2008   HYPERLIPIDEMIA 09/04/2008   HYPERTENSION 09/04/2008   LIVER FUNCTION TESTS, ABNORMAL, HX OF 09/04/2008    REFERRING PROVIDER: Nuala Alpha DO  REFERRING DIAG: Pain in right knee.  THERAPY DIAG:  Acute pain of right knee  Localized edema  Rationale for Evaluation and Treatment: Rehabilitation  ONSET DATE: About three weeks ago.  SUBJECTIVE:   SUBJECTIVE STATEMENT: Pain up to 6 today and all she did was clean out her vehicle. PERTINENT HISTORY: DM, h/o left knee pain. PAIN:  Are you having pain? Yes: NPRS scale: 6/10 Pain location: Right knee. Pain description: Ache Aggravating factors: As above. Relieving factors: As above.  PRECAUTIONS: Other: Pain-free there ex.  WEIGHT BEARING RESTRICTIONS: No  FALLS:  Has patient fallen in last 6 months? No  LIVING ENVIRONMENT: Lives with: lives with their spouse Lives in: House/apartment Stairs: Yes: External: 2 steps;   Has following equipment at home:  None  OCCUPATION: Retired.  PLOF: Independent  PATIENT GOALS: return to Pickle ball.  Play Golf.  NEXT MD VISIT:   OBJECTIVE:    PATIENT SURVEYS:  FOTO Complete.   EDEMA:  Circumferential: RT 2 cms > LT.    PALPATION: Tender to palpation over patient's right knee medial joint line.  LOWER EXTREMITY ROM: Right knee exhibits full extension and flexion to 123 degrees (left knee flexion is 132 degrees).  LOWER EXTREMITY MMT:  Normal right knee strength.  LOWER EXTREMITY SPECIAL TESTS:  Normal right knee stability.  Some pain increase with Valgus testing.    GAIT: Mild gait antalgia observed.   TODAY'S TREATMENT:                                                                                                                              DATE: 07/08/22:  Combo e'stim/US at 1.50 W/CM2  x 8 minutes to patient's right knee medial joint line f/b IFC at 80-150 Hz on 40% scan x 20 minutes to patient's right medial knee region with vasopneumatic on low.  Patient tolerated treatment without complaint with normal modality response following removal of modality.  ASSESSMENT:  CLINICAL IMPRESSION: Patient with 6/10 pain-level upon presentation to the clinic today.  She tried the recumbent bike for less than one minute but this caused her pain to increase.  Conservative treatment today to decrease pain.  Good response to treatment today.  OBJECTIVE IMPAIRMENTS: Abnormal gait, decreased activity tolerance, decreased ROM, increased edema, and pain.   ACTIVITY LIMITATIONS: squatting and locomotion level  PARTICIPATION LIMITATIONS:  Recreational activities.  REHAB POTENTIAL: Excellent  CLINICAL DECISION MAKING: Stable/uncomplicated  EVALUATION COMPLEXITY: Low   GOALS:  LONG TERM GOALS: Target date: 07/29/22.  Ind with a HEP. Goal status: INITIAL  2.  Perform ADL's with pain not > 2-3/10. Goal status: INITIAL  3.  Return to recreational activities. Goal status:  INITIAL PLAN:  PT FREQUENCY: 3x/week  PT DURATION: 4 weeks  PLANNED INTERVENTIONS: Therapeutic exercises, Therapeutic activity, Gait training, Patient/Family education, Self Care, Dry Needling, Electrical stimulation, Cryotherapy, Moist heat, Vasopneumatic device, Ultrasound, Ionotophoresis '4mg'$ /ml Dexamethasone, and Manual therapy  PLAN FOR NEXT SESSION: Modalities as needed.  Pain-free right knee there ex (O and CKC).   Jaely Silman, [place], PT 07/08/2022, 2:17 PM

## 2022-07-12 ENCOUNTER — Encounter: Payer: Self-pay | Admitting: Physical Therapy

## 2022-07-12 ENCOUNTER — Other Ambulatory Visit: Payer: Self-pay | Admitting: Endocrinology

## 2022-07-12 ENCOUNTER — Ambulatory Visit: Payer: 59 | Admitting: Physical Therapy

## 2022-07-12 DIAGNOSIS — R600 Localized edema: Secondary | ICD-10-CM

## 2022-07-12 DIAGNOSIS — M25561 Pain in right knee: Secondary | ICD-10-CM

## 2022-07-12 DIAGNOSIS — Z1231 Encounter for screening mammogram for malignant neoplasm of breast: Secondary | ICD-10-CM

## 2022-07-12 NOTE — Therapy (Signed)
OUTPATIENT PHYSICAL THERAPY LOWER EXTREMITY EVALUATION   Patient Name: SAVANAH BAYLES MRN: [id number] DOB:01/21/1966, 57 y.o., female Today's Date: 07/12/2022    PT End of Session - 07/12/22 1338     Visit Number 3    Number of Visits 12    Date for PT Re-Evaluation 07/29/22    Authorization Type FOTO    PT Start Time 0100    PT Stop Time 0147    PT Time Calculation (min) 47 min    Activity Tolerance Patient tolerated treatment well    Behavior During Therapy WFL for tasks assessed/performed             Past Medical History:  Diagnosis Date   Allergy    seasonal   Diabetes mellitus without complication ([place])    Hyperlipidemia    Nausea    due to gallbladder   Past Surgical History:  Procedure Laterality Date   CESAREAN SECTION      one c-section   TONSILLECTOMY AND ADENOIDECTOMY     57 years old   [place] Bilateral 1986   Patient Active Problem List   Diagnosis Date Noted   ABDOMINAL PAIN-EPIGASTRIC 09/05/2008   ABNORMAL TRANSAMINASE-LFT'S 09/05/2008   IDDM 09/04/2008   HYPERLIPIDEMIA 09/04/2008   HYPERTENSION 09/04/2008   LIVER FUNCTION TESTS, ABNORMAL, HX OF 09/04/2008    REFERRING PROVIDER: Nuala Alpha DO  REFERRING DIAG: Pain in right knee.  THERAPY DIAG:  Acute pain of right knee  Localized edema  Rationale for Evaluation and Treatment: Rehabilitation  ONSET DATE: About three weeks ago.  SUBJECTIVE:   SUBJECTIVE STATEMENT: Just walking in backyard can flare-up knee. PERTINENT HISTORY: DM, h/o left knee pain. PAIN:  Are you having pain? Yes: NPRS scale: 5/10 Pain location: Right knee. Pain description: Ache Aggravating factors: As above. Relieving factors: As above.  PRECAUTIONS: Other: Pain-free there ex.  WEIGHT BEARING RESTRICTIONS: No  FALLS:  Has patient fallen in last 6 months? No  LIVING ENVIRONMENT: Lives with: lives with their spouse Lives in: House/apartment Stairs: Yes: External: 2 steps;   Has  following equipment at home: None  OCCUPATION: Retired.  PLOF: Independent  PATIENT GOALS: return to Pickle ball.  Play Golf.  NEXT MD VISIT:   OBJECTIVE:    PATIENT SURVEYS:  FOTO Complete.   EDEMA:  Circumferential: RT 2 cms > LT.    PALPATION: Tender to palpation over patient's right knee medial joint line.  LOWER EXTREMITY ROM: Right knee exhibits full extension and flexion to 123 degrees (left knee flexion is 132 degrees).  LOWER EXTREMITY MMT:  Normal right knee strength.  LOWER EXTREMITY SPECIAL TESTS:  Normal right knee stability.  Some pain increase with Valgus testing.    GAIT: Mild gait antalgia observed.   TODAY'S TREATMENT:  DATE: 07/12/22:  Combo e'stim/US at 1.50 W/CM2 x 8 minutes to patient's right knee medial joint line f/b STW/M x 7 minutes f/b IFC at 80-150 Hz on 40% scan x 20 minutes to patient's right medial knee region with vasopneumatic on low f/b Iontophoresis 4 mg/ml.  Patient tolerated treatment without complaint with normal modality response following removal of modality.  ASSESSMENT:  CLINICAL IMPRESSION: Patient was found to be very tender over her right knee Pes Anserine bursa today.  Began Iontophoresis patch today. OBJECTIVE IMPAIRMENTS: Abnormal gait, decreased activity tolerance, decreased ROM, increased edema, and pain.   ACTIVITY LIMITATIONS: squatting and locomotion level  PARTICIPATION LIMITATIONS:  Recreational activities.  REHAB POTENTIAL: Excellent  CLINICAL DECISION MAKING: Stable/uncomplicated  EVALUATION COMPLEXITY: Low   GOALS:  LONG TERM GOALS: Target date: 07/29/22.  Ind with a HEP. Goal status: INITIAL  2.  Perform ADL's with pain not > 2-3/10. Goal status: INITIAL  3.  Return to recreational activities. Goal status: INITIAL PLAN:  PT FREQUENCY: 3x/week  PT DURATION: 4  weeks  PLANNED INTERVENTIONS: Therapeutic exercises, Therapeutic activity, Gait training, Patient/Family education, Self Care, Dry Needling, Electrical stimulation, Cryotherapy, Moist heat, Vasopneumatic device, Ultrasound, Ionotophoresis '4mg'$ /ml Dexamethasone, and Manual therapy  PLAN FOR NEXT SESSION: Modalities as needed.  Pain-free right knee there ex (O and CKC).   Tionna Gigante, [place], PT 07/12/2022, 1:47 PM

## 2022-07-15 ENCOUNTER — Encounter: Payer: Self-pay | Admitting: *Deleted

## 2022-07-15 ENCOUNTER — Ambulatory Visit: Payer: 59 | Admitting: *Deleted

## 2022-07-15 DIAGNOSIS — M25561 Pain in right knee: Secondary | ICD-10-CM

## 2022-07-15 DIAGNOSIS — R600 Localized edema: Secondary | ICD-10-CM

## 2022-07-15 NOTE — Therapy (Signed)
OUTPATIENT PHYSICAL THERAPY LOWER EXTREMITY EVALUATION   Patient Name: ASTORIA CONDON MRN: [id number] DOB:[birth year], 57 y.o., female Today's Date: 07/15/2022    PT End of Session - 07/15/22 1300     Visit Number 4    Number of Visits 12    Date for PT Re-Evaluation 07/29/22    Authorization Type FOTO    PT Start Time 1300    PT Stop Time 1350    PT Time Calculation (min) 50 min             Past Medical History:  Diagnosis Date   Allergy    seasonal   Diabetes mellitus without complication ([place])    Hyperlipidemia    Nausea    due to gallbladder   Past Surgical History:  Procedure Laterality Date   CESAREAN SECTION      one c-section   TONSILLECTOMY AND ADENOIDECTOMY     58 years old   [place] EXTRACTION Bilateral 1986   Patient Active Problem List   Diagnosis Date Noted   ABDOMINAL PAIN-EPIGASTRIC 09/05/2008   ABNORMAL TRANSAMINASE-LFT'S 09/05/2008   IDDM 09/04/2008   HYPERLIPIDEMIA 09/04/2008   HYPERTENSION 09/04/2008   LIVER FUNCTION TESTS, ABNORMAL, HX OF 09/04/2008    REFERRING PROVIDER: Nuala Alpha DO  REFERRING DIAG: Pain in right knee.  THERAPY DIAG:  Acute pain of right knee  Localized edema  Rationale for Evaluation and Treatment: Rehabilitation  ONSET DATE: About three weeks ago.  SUBJECTIVE:   SUBJECTIVE STATEMENT:  Doing better. I was able to perform the elliptical for 5 mins the other day PERTINENT HISTORY: DM, h/o left knee pain. PAIN:  Are you having pain? Yes: NPRS scale: 3-4/10 Pain location: Right knee. Pain description: Ache Aggravating factors: As above. Relieving factors: As above.  PRECAUTIONS: Other: Pain-free there ex.  WEIGHT BEARING RESTRICTIONS: No  FALLS:  Has patient fallen in last 6 months? No  LIVING ENVIRONMENT: Lives with: lives with their spouse Lives in: House/apartment Stairs: Yes: External: 2 steps;   Has following equipment at home: None  OCCUPATION: Retired.  PLOF:  Independent  PATIENT GOALS: return to Pickle ball.  Play Golf.  NEXT MD VISIT:   OBJECTIVE:    PATIENT SURVEYS:  FOTO Complete.   EDEMA:  Circumferential: RT 2 cms > LT.    PALPATION: Tender to palpation over patient's right knee medial joint line.  LOWER EXTREMITY ROM: Right knee exhibits full extension and flexion to 123 degrees (left knee flexion is 132 degrees).  LOWER EXTREMITY MMT:  Normal right knee strength.  LOWER EXTREMITY SPECIAL TESTS:  Normal right knee stability.  Some pain increase with Valgus testing.    GAIT: Mild gait antalgia observed.   TODAY'S TREATMENT:                                                                                                                              DATE:  07/15/22:   LAQ's 2# x 10 hold 5secs, 3# x 10 hold 5 secs no pain Rocker board x 5 mins  Combo e'stim/US at 1.50 W/CM2 x 8 minutes to patient's right knee medial joint line   IFC at 80-150 Hz on 40% scan x 10 minutes to patient's right medial knee region with  vasopneumatic on low  RT knee x 10 mins Iontophoresis 4 mg/ml. ( 80 MA/mins) X 8 mins to RT knee pes anserine area.   2/6   Patient tolerated treatment without complaint with normal modality response following removal of modality.  ASSESSMENT:  CLINICAL IMPRESSION: Patient arrived today doing better with decreased pain after last Rx. She was able to perform some exs today without any flare-ups. Combo and Vaso/ IFC tolerated well with no complaints  Iontophoresis patch today 2/6 today   OBJECTIVE IMPAIRMENTS: Abnormal gait, decreased activity tolerance, decreased ROM, increased edema, and pain.   ACTIVITY LIMITATIONS: squatting and locomotion level  PARTICIPATION LIMITATIONS:  Recreational activities.  REHAB POTENTIAL: Excellent  CLINICAL DECISION MAKING: Stable/uncomplicated  EVALUATION COMPLEXITY: Low   GOALS:  LONG  TERM GOALS: Target date: 07/29/22.  Ind with a HEP. Goal status: INITIAL  2.  Perform ADL's with pain not > 2-3/10. Goal status: INITIAL  3.  Return to recreational activities. Goal status: INITIAL PLAN:  PT FREQUENCY: 3x/week  PT DURATION: 4 weeks  PLANNED INTERVENTIONS: Therapeutic exercises, Therapeutic activity, Gait training, Patient/Family education, Self Care, Dry Needling, Electrical stimulation, Cryotherapy, Moist heat, Vasopneumatic device, Ultrasound, Ionotophoresis '4mg'$ /ml Dexamethasone, and Manual therapy  PLAN FOR NEXT SESSION: Modalities as needed.  Pain-free right knee there ex (O and CKC).   Marinell Igarashi,CHRIS, PTA 07/15/2022, 3:27 PM

## 2022-07-19 ENCOUNTER — Ambulatory Visit: Payer: 59 | Admitting: Physical Therapy

## 2022-07-19 DIAGNOSIS — M25561 Pain in right knee: Secondary | ICD-10-CM

## 2022-07-19 DIAGNOSIS — R600 Localized edema: Secondary | ICD-10-CM

## 2022-07-19 NOTE — Therapy (Signed)
OUTPATIENT PHYSICAL THERAPY LOWER EXTREMITY EVALUATION   Patient Name: Erin Banks MRN: [id number] DOB:January 21, 1966, 57 y.o., female Today's Date: 07/19/2022    PT End of Session - 07/19/22 1226     Visit Number 5    Number of Visits 12    Date for PT Re-Evaluation 07/29/22    Authorization Type FOTO    PT Start Time 1145    PT Stop Time 1232    PT Time Calculation (min) 47 min    Activity Tolerance Patient tolerated treatment well    Behavior During Therapy WFL for tasks assessed/performed             Past Medical History:  Diagnosis Date   Allergy    seasonal   Diabetes mellitus without complication ([place])    Hyperlipidemia    Nausea    due to gallbladder   Past Surgical History:  Procedure Laterality Date   CESAREAN SECTION      one c-section   TONSILLECTOMY AND ADENOIDECTOMY     57 years old   [place] EXTRACTION Bilateral 1986   Patient Active Problem List   Diagnosis Date Noted   ABDOMINAL PAIN-EPIGASTRIC 09/05/2008   ABNORMAL TRANSAMINASE-LFT'S 09/05/2008   IDDM 09/04/2008   HYPERLIPIDEMIA 09/04/2008   HYPERTENSION 09/04/2008   LIVER FUNCTION TESTS, ABNORMAL, HX OF 09/04/2008    REFERRING PROVIDER: Nuala Alpha DO  REFERRING DIAG: Pain in right knee.  THERAPY DIAG:  Acute pain of right knee  Localized edema  Rationale for Evaluation and Treatment: Rehabilitation  ONSET DATE: About three weeks ago.  SUBJECTIVE:   SUBJECTIVE STATEMENT: More sore than pain today.  Able to play 5 games of Pickle ball and do elliptical with no incline for 5 minute sessions. PERTINENT HISTORY: DM, h/o left knee pain. PAIN:  "Sore"  PRECAUTIONS: Other: Pain-free there ex.  WEIGHT BEARING RESTRICTIONS: No  FALLS:  Has patient fallen in last 6 months? No  LIVING ENVIRONMENT: Lives with: lives with their spouse Lives in: House/apartment Stairs: Yes: External: 2 steps;   Has following equipment at home: None  OCCUPATION: Retired.  PLOF:  Independent  PATIENT GOALS: return to Pickle ball.  Play Golf.  NEXT MD VISIT:   OBJECTIVE:    PATIENT SURVEYS:  FOTO Complete.   EDEMA:  Circumferential: RT 2 cms > LT.    PALPATION: Tender to palpation over patient's right knee medial joint line.  LOWER EXTREMITY ROM: Right knee exhibits full extension and flexion to 123 degrees (left knee flexion is 132 degrees).  LOWER EXTREMITY MMT:  Normal right knee strength.  LOWER EXTREMITY SPECIAL TESTS:  Normal right knee stability.  Some pain increase with Valgus testing.    GAIT: Mild gait antalgia observed.   TODAY'S TREATMENT:                                                                                                                              DATE: 07/19/22:  Combo  e'stim/US at 1.50 W/CM2 x 8 minutes to patient's right knee medial joint line f/b IFC at 80-150 Hz on 40% scan x 20 minutes to patient's right medial knee region with vasopneumatic on low f/b Iontophoresis 4 mg/ml.  Patient tolerated treatment without complaint with normal modality response following removal of modality.  ASSESSMENT:  CLINICAL IMPRESSION: Sore over right Pes Anserine but much better and able to play some Pickle Ball and do elliptical on low-level.   ACTIVITY LIMITATIONS: squatting and locomotion level  PARTICIPATION LIMITATIONS:  Recreational activities.  REHAB POTENTIAL: Excellent  CLINICAL DECISION MAKING: Stable/uncomplicated  EVALUATION COMPLEXITY: Low   GOALS:  LONG TERM GOALS: Target date: 07/29/22.  Ind with a HEP. Goal status: INITIAL  2.  Perform ADL's with pain not > 2-3/10. Goal status: INITIAL  3.  Return to recreational activities. Goal status: INITIAL PLAN:  PT FREQUENCY: 3x/week  PT DURATION: 4 weeks  PLANNED INTERVENTIONS: Therapeutic exercises, Therapeutic activity, Gait training, Patient/Family education, Self Care, Dry Needling, Electrical stimulation, Cryotherapy, Moist heat, Vasopneumatic  device, Ultrasound, Ionotophoresis '4mg'$ /ml Dexamethasone, and Manual therapy  PLAN FOR NEXT SESSION: Modalities as needed.  Pain-free right knee there ex (O and CKC).   Cailee Blanke, [place], PT 07/19/2022, 12:37 PM

## 2022-07-22 ENCOUNTER — Encounter: Payer: 59 | Admitting: Physical Therapy

## 2022-08-11 ENCOUNTER — Ambulatory Visit (HOSPITAL_COMMUNITY): Payer: 59 | Attending: Endocrinology | Admitting: Physical Therapy

## 2022-08-11 ENCOUNTER — Encounter (HOSPITAL_COMMUNITY): Payer: Self-pay | Admitting: Physical Therapy

## 2022-08-11 DIAGNOSIS — R42 Dizziness and giddiness: Secondary | ICD-10-CM | POA: Insufficient documentation

## 2022-08-11 NOTE — Therapy (Signed)
OUTPATIENT PHYSICAL THERAPY VESTIBULAR EVALUATION     Patient Name: Erin Banks MRN: [id number] DOB:02/24/1966, 57 y.o., female Today's Date: 08/11/2022  END OF SESSION:  PT End of Session - 08/11/22 1024     Visit Number 1    Number of Visits 4    Date for PT Re-Evaluation 09/08/22    Authorization Type [employer]    Progress Note Due on Visit 4    PT Start Time 0945    PT Stop Time 1025    PT Time Calculation (min) 40 min    Activity Tolerance Patient tolerated treatment well    Behavior During Therapy WFL for tasks assessed/performed             Past Medical History:  Diagnosis Date   Allergy    seasonal   Diabetes mellitus without complication ([place])    Hyperlipidemia    Nausea    due to gallbladder   Past Surgical History:  Procedure Laterality Date   CESAREAN SECTION      one c-section   TONSILLECTOMY AND ADENOIDECTOMY     56 years old   [place] Bilateral 1986   Patient Active Problem List   Diagnosis Date Noted   ABDOMINAL PAIN-EPIGASTRIC 09/05/2008   ABNORMAL TRANSAMINASE-LFT'S 09/05/2008   IDDM 09/04/2008   HYPERLIPIDEMIA 09/04/2008   HYPERTENSION 09/04/2008   LIVER FUNCTION TESTS, ABNORMAL, HX OF 09/04/2008    PCP: Reynold Bowen, MD REFERRING PROVIDER: Reynold Bowen, MD  REFERRING DIAG: vertigo  THERAPY DIAG:  Dizziness and giddiness - Plan: PT plan of care cert/re-cert  ONSET DATE: Chronic but most recent flare up in December 2023   Rationale for Evaluation and Treatment: Rehabilitation  SUBJECTIVE:   SUBJECTIVE STATEMENT: Patient presents to therapy with complaint of vertigo. This has been off and on since 1999. Flared up recently. Feels this is positional and describes as spinning sensation.   Pt accompanied by: significant other  PERTINENT HISTORY: History of vertigo   PAIN:  Are you having pain? No  PRECAUTIONS: None  WEIGHT BEARING RESTRICTIONS: No  FALLS: Has patient fallen in last 6 months? No  LIVING  ENVIRONMENT: Lives with: lives with their family and lives with their spouse Lives in: House/apartment Stairs: Yes: External: 2 steps; none Has following equipment at home: None  PLOF: Independent  PATIENT GOALS: Not be dizzy   OBJECTIVE:   DIAGNOSTIC FINDINGS: NA  COGNITION: Overall cognitive status: Within functional limits for tasks assessed  Cervical ROM:    Bilat rotation and flexion WNL, extension limited by onset of dizziness (about 50% limited)   BED MOBILITY:  Sit to supine Complete Independence Supine to sit Complete Independence Rolling to Right Complete Independence Rolling to Left Complete Independence  TRANSFERS: Assistive device utilized: None  Sit to stand: Complete Independence Stand to sit: Complete Independence Chair to chair: Complete Independence    VESTIBULAR ASSESSMENT:  GENERAL OBSERVATION: Unremarkable    SYMPTOM BEHAVIOR:  Subjective history: See above   Non-Vestibular symptoms: nausea/vomiting  Type of dizziness: Spinning/Vertigo  Frequency: daily   Duration: 10-15 seconds  Aggravating factors:  rolling onto RT side, laying down, getting up off of couch   Relieving factors:  Fixed gaze, staying till  Progression of symptoms: unchanged  OCULOMOTOR EXAM:  Ocular Alignment: normal  Ocular ROM: No Limitations  Spontaneous Nystagmus: absent  Gaze-Induced Nystagmus: absent  Smooth Pursuits: intact   VESTIBULAR - OCULAR REFLEX:   Slow VOR: Normal    MOTION SENSITIVITY:  Motion Sensitivity Quotient  Intensity: 0 = none, 1 = Lightheaded, 2 = Mild, 3 = Moderate, 4 = Severe, 5 = Vomiting  Intensity  1. Sitting to supine 3  2. Supine to L side 1  3. Supine to R side 2  4. Supine to sitting   5. L Hallpike-Dix 1  6. Up from L  1  7. R Hallpike-Dix 3  8. Up from R  1  9. Sitting, head tipped to L knee   10. Head up from L knee   11. Sitting, head tipped to R knee   12. Head up from R knee   13. Sitting head turns x5 0   14.Sitting head nods x5 3  15. In stance, 180 turn to L    16. In stance, 180 turn to R     VESTIBULAR TREATMENT:                                                                                                   DATE:  08/11/22 Eval    PATIENT EDUCATION: Education details: on Eval findings, POC and HEP  Person educated: Patient and Spouse Education method: Explanation Education comprehension: verbalized understanding  HOME EXERCISE PROGRAM:  GOALS: SHORT TERM GOALS: Target date: 08/25/2022  Patient will be independent with initial HEP and self-management strategies to improve functional outcomes Baseline:  Goal status: INITIAL   LONG TERM GOALS: Target date: 09/08/22  Patient will be independent with advanced HEP and self-management strategies to improve functional outcomes Baseline:  Goal status: INITIAL  2.  Patient will reports at least 75% reduction in subjective complaint of dizziness for improved functional outcomes and ability to perform ADLs.  Baseline:  Goal status: INITIAL  ASSESSMENT:  CLINICAL IMPRESSION: Patient is a 57 y.o. female who presents to physical therapy with complaint of vertigo. Patient demonstrates increased vestibular symptoms with provocative testing which is negatively impacting patient ability to perform ADLs and functional mobility tasks. Patient will benefit from skilled physical therapy services to address these deficits to improve level of function with ADLs, functional mobility tasks, and reduce risk for falls.    OBJECTIVE IMPAIRMENTS: decreased mobility, difficulty walking, and dizziness.   ACTIVITY LIMITATIONS: bending, bed mobility, and locomotion level  PARTICIPATION LIMITATIONS: meal prep, cleaning, laundry, shopping, community activity, and yard work  PERSONAL FACTORS: Time since onset of injury/illness/exacerbation are also affecting patient's functional outcome.   REHAB POTENTIAL: Good  CLINICAL DECISION MAKING:  Stable/uncomplicated  EVALUATION COMPLEXITY: Low   PLAN:  PT FREQUENCY: 1x/week  PT DURATION: 4 weeks  PLANNED INTERVENTIONS: Therapeutic exercises, Therapeutic activity, Neuromuscular re-education, Balance training, Gait training, Patient/Family education, Self Care, Joint mobilization, Vestibular training, and Canalith repositioning Therapeutic exercises, Therapeutic activity, Neuromuscular re-education, Balance training, Gait training, Patient/Family education, Joint manipulation, Joint mobilization, Stair training, Aquatic Therapy, Dry Needling, Electrical stimulation, Spinal manipulation, Spinal mobilization, Cryotherapy, Moist heat, scar mobilization, Taping, Traction, Ultrasound, Biofeedback, Ionotophoresis '4mg'$ /ml Dexamethasone, and Manual therapy.   PLAN FOR NEXT SESSION: Retest [place]HallPike on RT. Repeat CRM as indicated. Vestibular habituation as needed.   10:26 AM, 08/11/22 Josue Hector PT DPT  Physical  Therapist with [facility]  [phone]

## 2022-08-19 ENCOUNTER — Encounter (HOSPITAL_COMMUNITY): Payer: 59

## 2022-08-26 ENCOUNTER — Encounter (HOSPITAL_COMMUNITY): Payer: 59 | Admitting: Physical Therapy

## 2022-08-31 ENCOUNTER — Ambulatory Visit
Admission: RE | Admit: 2022-08-31 | Discharge: 2022-08-31 | Disposition: A | Discharge status: Home / Self Care | Payer: 59 | Source: Ambulatory Visit | Attending: Endocrinology | Admitting: Endocrinology

## 2022-08-31 DIAGNOSIS — Z1231 Encounter for screening mammogram for malignant neoplasm of breast: Secondary | ICD-10-CM

## 2022-09-02 ENCOUNTER — Ambulatory Visit (HOSPITAL_COMMUNITY): Payer: 59 | Admitting: Physical Therapy

## 2022-09-02 DIAGNOSIS — R42 Dizziness and giddiness: Secondary | ICD-10-CM | POA: Diagnosis not present

## 2022-09-02 NOTE — Therapy (Signed)
OUTPATIENT PHYSICAL THERAPY VESTIBULAR TREATMENT     Patient Name: Erin Banks MRN: [id number] DOB:1966/03/12, 57 y.o., female Today's Date: 09/02/2022  END OF SESSION:  PT End of Session - 09/02/22 1026     Visit Number 2    Number of Visits 4    Date for PT Re-Evaluation 09/08/22    Authorization Type [employer]    Progress Note Due on Visit 4    PT Start Time 1031    PT Stop Time 1105    PT Time Calculation (min) 34 min    Activity Tolerance Other (comment)   nausea   Behavior During Therapy WFL for tasks assessed/performed             Past Medical History:  Diagnosis Date   Allergy    seasonal   Diabetes mellitus without complication ([place])    Hyperlipidemia    Nausea    due to gallbladder   Past Surgical History:  Procedure Laterality Date   CESAREAN SECTION      one c-section   TONSILLECTOMY AND ADENOIDECTOMY     57 years old   [place] EXTRACTION Bilateral 1986   Patient Active Problem List   Diagnosis Date Noted   ABDOMINAL PAIN-EPIGASTRIC 09/05/2008   ABNORMAL TRANSAMINASE-LFT'S 09/05/2008   IDDM 09/04/2008   HYPERLIPIDEMIA 09/04/2008   HYPERTENSION 09/04/2008   LIVER FUNCTION TESTS, ABNORMAL, HX OF 09/04/2008    PCP: Reynold Bowen, MD REFERRING PROVIDER: Reynold Bowen, MD  REFERRING DIAG: vertigo  THERAPY DIAG:  Dizziness and giddiness  ONSET DATE: Chronic but most recent flare up in December 2023   Rationale for Evaluation and Treatment: Rehabilitation  SUBJECTIVE:   SUBJECTIVE STATEMENT:  Feeling much better. Had only one episode of dizziness when tipping her head back last week. She was also sick with sinus infection which may have contributed to this. No issues rolling in bed since last session.    Eval: Patient presents to therapy with complaint of vertigo. This has been off and on since 1999. Flared up recently. Feels this is positional and describes as spinning sensation.   Pt accompanied by: significant other  PERTINENT  HISTORY: History of vertigo   PAIN:  Are you having pain? No  PRECAUTIONS: None  WEIGHT BEARING RESTRICTIONS: No  FALLS: Has patient fallen in last 6 months? No  LIVING ENVIRONMENT: Lives with: lives with their family and lives with their spouse Lives in: House/apartment Stairs: Yes: External: 2 steps; none Has following equipment at home: None  PLOF: Independent  PATIENT GOALS: Not be dizzy   OBJECTIVE:   DIAGNOSTIC FINDINGS: NA  COGNITION: Overall cognitive status: Within functional limits for tasks assessed  Cervical ROM:    Bilat rotation and flexion WNL, extension limited by onset of dizziness (about 50% limited)   BED MOBILITY:  Sit to supine Complete Independence Supine to sit Complete Independence Rolling to Right Complete Independence Rolling to Left Complete Independence  TRANSFERS: Assistive device utilized: None  Sit to stand: Complete Independence Stand to sit: Complete Independence Chair to chair: Complete Independence    VESTIBULAR ASSESSMENT:  GENERAL OBSERVATION: Unremarkable    SYMPTOM BEHAVIOR:  Subjective history: See above   Non-Vestibular symptoms: nausea/vomiting  Type of dizziness: Spinning/Vertigo  Frequency: daily   Duration: 10-15 seconds  Aggravating factors:  rolling onto RT side, laying down, getting up off of couch   Relieving factors:  Fixed gaze, staying till  Progression of symptoms: unchanged  OCULOMOTOR EXAM:  Ocular Alignment: normal  Ocular  ROM: No Limitations  Spontaneous Nystagmus: absent  Gaze-Induced Nystagmus: absent  Smooth Pursuits: intact   VESTIBULAR - OCULAR REFLEX:   Slow VOR: Normal    MOTION SENSITIVITY:  Motion Sensitivity Quotient Intensity: 0 = none, 1 = Lightheaded, 2 = Mild, 3 = Moderate, 4 = Severe, 5 = Vomiting  Intensity  1. Sitting to supine 3  2. Supine to L side 1  3. Supine to R side 2  4. Supine to sitting   5. L Hallpike-Dix 1  6. Up from L  1  7. R Hallpike-Dix 3  8. Up  from R  1  9. Sitting, head tipped to L knee   10. Head up from L knee   11. Sitting, head tipped to R knee   12. Head up from R knee   13. Sitting head turns x5 0  14.Sitting head nods x5 3  15. In stance, 180 turn to L    16. In stance, 180 turn to R     VESTIBULAR TREATMENT:                                                                                                   DATE:  09/02/22 Epply Rt side (positive, 12-15 sec RT torsional nystagmus) feels nauseous following  Seated head turns x10 (increased nausea)   08/11/22 Eval    PATIENT EDUCATION: Education details: on Eval findings, POC and HEP  Person educated: Patient and Spouse Education method: Explanation Education comprehension: verbalized understanding  HOME EXERCISE PROGRAM:  Access Code: [email or web address] URL: [email or web address] Date: 09/02/2022 Prepared by: Josue Hector  Exercises - Vestibular Habituation - Seated Horizontal Head Rotation  - 3 x daily - 7 x weekly - 2-3 sets - 10 reps  GOALS: SHORT TERM GOALS: Target date: 08/25/2022  Patient will be independent with initial HEP and self-management strategies to improve functional outcomes Baseline:  Goal status: INITIAL   LONG TERM GOALS: Target date: 09/08/22  Patient will be independent with advanced HEP and self-management strategies to improve functional outcomes Baseline:  Goal status: INITIAL  2.  Patient will reports at least 75% reduction in subjective complaint of dizziness for improved functional outcomes and ability to perform ADLs.  Baseline:  Goal status: INITIAL  ASSESSMENT:  CLINICAL IMPRESSION: Performed Eppley with positive result on RT side. Patient noting reduced spinning vertigo, but increased nausea. Sx reduce with rest. Initiated seated vestibular habituation. Patient noting increased nausea with seated head turns. Educated patient on purpose of activity and added to HEP. Patient encouraged to rest to allow sx to  subside. Limited today's activity per patient subjective of increased nausea with vestibular activities performed. Issued HEP handout. Will reassess next session and continue as indicated to reduce vertigo sx/ dizziness/ nausea.    OBJECTIVE IMPAIRMENTS: decreased mobility, difficulty walking, and dizziness.   ACTIVITY LIMITATIONS: bending, bed mobility, and locomotion level  PARTICIPATION LIMITATIONS: meal prep, cleaning, laundry, shopping, community activity, and yard work  PERSONAL FACTORS: Time since onset of injury/illness/exacerbation are also affecting patient's functional outcome.   REHAB POTENTIAL: Good  CLINICAL DECISION MAKING: Stable/uncomplicated  EVALUATION COMPLEXITY: Low   PLAN:  PT FREQUENCY: 1x/week  PT DURATION: 4 weeks  PLANNED INTERVENTIONS: Therapeutic exercises, Therapeutic activity, Neuromuscular re-education, Balance training, Gait training, Patient/Family education, Self Care, Joint mobilization, Vestibular training, and Canalith repositioning Therapeutic exercises, Therapeutic activity, Neuromuscular re-education, Balance training, Gait training, Patient/Family education, Joint manipulation, Joint mobilization, Stair training, Aquatic Therapy, Dry Needling, Electrical stimulation, Spinal manipulation, Spinal mobilization, Cryotherapy, Moist heat, scar mobilization, Taping, Traction, Ultrasound, Biofeedback, Ionotophoresis '4mg'$ /ml Dexamethasone, and Manual therapy.   PLAN FOR NEXT SESSION: Retest DixHallPike on RT if needed, Check LT also. Repeat CRM as indicated. Continue vestibular habituation as needed. Adjust POC as indicated   11:06 AM, 09/02/22 Josue Hector PT DPT  Physical Therapist with [facility]  [phone]

## 2022-09-07 ENCOUNTER — Encounter (HOSPITAL_COMMUNITY): Payer: 59 | Admitting: Physical Therapy

## 2022-09-29 ENCOUNTER — Encounter (HOSPITAL_COMMUNITY): Payer: 59 | Admitting: Physical Therapy

## 2022-12-29 ENCOUNTER — Encounter: Payer: Self-pay | Admitting: Internal Medicine

## 2023-01-20 ENCOUNTER — Encounter: Payer: Self-pay | Admitting: Internal Medicine

## 2023-02-03 ENCOUNTER — Encounter: Payer: Self-pay | Admitting: Internal Medicine

## 2023-04-22 ENCOUNTER — Telehealth: Payer: Self-pay

## 2023-04-22 ENCOUNTER — Ambulatory Visit (AMBULATORY_SURGERY_CENTER): Payer: 59

## 2023-04-22 VITALS — Ht 66.0 in | Wt 158.0 lb

## 2023-04-22 DIAGNOSIS — Z800 Family history of malignant neoplasm of digestive organs: Secondary | ICD-10-CM

## 2023-04-22 MED ORDER — NA SULFATE-K SULFATE-MG SULF 17.5-3.13-1.6 GM/177ML PO SOLN: 1.0000 | Freq: Once | ORAL | 0 refills | Status: AC

## 2023-04-22 NOTE — Telephone Encounter (Signed)
During pre visit patient disclosed that she was on insulin pump and Jardiance.  Prescribing physician will need to contacted to obtain written instructions within one week of the procedure.

## 2023-04-22 NOTE — Progress Notes (Signed)

## 2023-04-25 NOTE — Telephone Encounter (Signed)
Left message for pt to call back to find out which physician manages her insulin pump.

## 2023-04-27 ENCOUNTER — Encounter: Payer: Self-pay | Admitting: Internal Medicine

## 2023-04-29 ENCOUNTER — Encounter: Payer: 59 | Admitting: Internal Medicine

## 2023-05-02 NOTE — Telephone Encounter (Signed)
Called pt and she reports Dr. Evlyn Kanner manages her insulin pump. Letter faxed requesting instructions for insulin pump prior to procedures.

## 2023-05-05 NOTE — Telephone Encounter (Signed)
Call placed to Dr. Rinaldo Cloud office to check on Insulin pump instructions. Left message on Dr. Rinaldo Cloud voicemail.  Erin Banks called and requested letter be refaxed, sent to [phone]. Refaxed as requested.

## 2023-05-09 NOTE — Telephone Encounter (Signed)
Per Dr. Evlyn Kanner pt is to stay on her basel rate for her insulin pump. Called pt and left a detailed message on her answering machine.

## 2023-05-10 ENCOUNTER — Ambulatory Visit (AMBULATORY_SURGERY_CENTER): Payer: 59 | Admitting: Internal Medicine

## 2023-05-10 ENCOUNTER — Encounter: Payer: Self-pay | Admitting: Internal Medicine

## 2023-05-10 VITALS — BP 101/56 | HR 77 | Temp 97.8°F | Resp 13 | Ht 64.0 in | Wt 158.0 lb

## 2023-05-10 DIAGNOSIS — Z09 Encounter for follow-up examination after completed treatment for conditions other than malignant neoplasm: Secondary | ICD-10-CM

## 2023-05-10 DIAGNOSIS — Z860100 Personal history of colon polyps, unspecified: Secondary | ICD-10-CM

## 2023-05-10 DIAGNOSIS — Z860101 Personal history of adenomatous and serrated colon polyps: Secondary | ICD-10-CM | POA: Diagnosis not present

## 2023-05-10 DIAGNOSIS — Z800 Family history of malignant neoplasm of digestive organs: Secondary | ICD-10-CM

## 2023-05-10 MED ORDER — SODIUM CHLORIDE 0.9 % IV SOLN: 500.0000 mL | Freq: Once | INTRAVENOUS | Status: DC

## 2023-05-10 NOTE — Progress Notes (Signed)
Vss nad trans to pacu 

## 2023-05-10 NOTE — Op Note (Signed)
[facility] Patient Name: Erin Banks Procedure Date: 05/10/2023 8:54 AM MRN: [id number] Endoscopist: Wilhemina Bonito. Marina Goodell , MD, [id number] Age: 57 Referring MD:  Date of Birth: 01-Nov-1965 Gender: Female Account #: [id number] Procedure:                Colonoscopy Indications:              High risk colon cancer surveillance: Personal                            history of non-advanced adenoma (2019). Also father                            with colon cancer less than age 71 Medicines:                Monitored Anesthesia Care Procedure:                Pre-Anesthesia Assessment:                           - Prior to the procedure, a History and Physical                            was performed, and patient medications and                            allergies were reviewed. The patient's tolerance of                            previous anesthesia was also reviewed. The risks                            and benefits of the procedure and the sedation                            options and risks were discussed with the patient.                            All questions were answered, and informed consent                            was obtained. Prior Anticoagulants: The patient has                            taken no anticoagulant or antiplatelet agents. ASA                            Grade Assessment: II - A patient with mild systemic                            disease. After reviewing the risks and benefits,                            the patient was deemed in satisfactory condition to  undergo the procedure.                           After obtaining informed consent, the colonoscope                            was passed under direct vision. Throughout the                            procedure, the patient's blood pressure, pulse, and                            oxygen saturations were monitored continuously. The                            CF [id number] #[id number] was  introduced through the anus                            and advanced to the the cecum, identified by                            appendiceal orifice and ileocecal valve. The                            ileocecal valve, appendiceal orifice, and rectum                            were photographed. The quality of the bowel                            preparation was excellent. The colonoscopy was                            performed without difficulty. The patient tolerated                            the procedure well. The bowel preparation used was                            SUPREP via split dose instruction. Scope In: 9:12:32 AM Scope Out: 9:26:58 AM Scope Withdrawal Time: 0 hours 7 minutes 48 seconds  Total Procedure Duration: 0 hours 14 minutes 26 seconds  Findings:                 The entire examined colon appeared normal on direct                            and retroflexion views. Complications:            No immediate complications. Estimated blood loss:                            None. Estimated Blood Loss:     Estimated blood loss: none. Impression:               - The entire examined colon  is normal on direct and                            retroflexion views.                           - No specimens collected. Recommendation:           - Repeat colonoscopy in 5 years for surveillance                            (family history).                           - Patient has a contact number available for                            emergencies. The signs and symptoms of potential                            delayed complications were discussed with the                            patient. Return to normal activities tomorrow.                            Written discharge instructions were provided to the                            patient.                           - Resume previous diet.                           - Continue present medications. Wilhemina Bonito. Marina Goodell, MD 05/10/2023 9:32:19 AM This  report has been signed electronically.

## 2023-05-10 NOTE — Patient Instructions (Signed)
REPEAT Colonoscopy in 5 years for surveillance (family history).  YOU HAD AN ENDOSCOPIC PROCEDURE TODAY AT THE [facility]:   Refer to the procedure report that was given to you for any specific questions about what was found during the examination.  If the procedure report does not answer your questions, please call your gastroenterologist to clarify.  If you requested that your care partner not be given the details of your procedure findings, then the procedure report has been included in a sealed envelope for you to review at your convenience later.  YOU SHOULD EXPECT: Some feelings of bloating in the abdomen. Passage of more gas than usual.  Walking can help get rid of the air that was put into your GI tract during the procedure and reduce the bloating. If you had a lower endoscopy (such as a colonoscopy or flexible sigmoidoscopy) you may notice spotting of blood in your stool or on the toilet paper. If you underwent a bowel prep for your procedure, you may not have a normal bowel movement for a few days.  Please Note:  You might notice some irritation and congestion in your nose or some drainage.  This is from the oxygen used during your procedure.  There is no need for concern and it should clear up in a day or so.  SYMPTOMS TO REPORT IMMEDIATELY:  Following lower endoscopy (colonoscopy or flexible sigmoidoscopy):  Excessive amounts of blood in the stool  Significant tenderness or worsening of abdominal pains  Swelling of the abdomen that is new, acute  Fever of 100F or higher  For urgent or emergent issues, a gastroenterologist can be reached at any hour by calling [phone]. Do not use MyChart messaging for urgent concerns.    DIET:  We do recommend a small meal at first, but then you may proceed to your regular diet.  Drink plenty of fluids but you should avoid alcoholic beverages for 24 hours.  ACTIVITY:  You should plan to take it easy for the rest of today and you  should NOT DRIVE or use heavy machinery until tomorrow (because of the sedation medicines used during the test).    FOLLOW UP: Our staff will call the number listed on your records the next business day following your procedure.  We will call around 7:15- 8:00 am to check on you and address any questions or concerns that you may have regarding the information given to you following your procedure. If we do not reach you, we will leave a message.     If any biopsies were taken you will be contacted by phone or by letter within the next 1-3 weeks.  Please call us at [phone] if you have not heard about the biopsies in 3 weeks.    SIGNATURES/CONFIDENTIALITY: You and/or your care partner have signed paperwork which will be entered into your electronic medical record.  These signatures attest to the fact that that the information above on your After Visit Summary has been reviewed and is understood.  Full responsibility of the confidentiality of this discharge information lies with you and/or your care-partner.

## 2023-05-10 NOTE — Progress Notes (Signed)
Pt's states no medical or surgical changes since previsit or office visit. 

## 2023-05-10 NOTE — Progress Notes (Signed)
HISTORY OF PRESENT ILLNESS:  Erin Banks is a 57 y.o. female with family history of colon cancer and a personal history of adenomatous polyps.  Now for surveillance colonoscopy  REVIEW OF SYSTEMS:  All non-GI ROS negative except for  Past Medical History:  Diagnosis Date   Allergy    seasonal   Diabetes mellitus without complication ([facility])    Hyperlipidemia    Nausea    due to gallbladder    Past Surgical History:  Procedure Laterality Date   CESAREAN SECTION      one c-section   TONSILLECTOMY AND ADENOIDECTOMY     57 years old   WISDOM TOOTH EXTRACTION Bilateral 1986    Social History Erin Banks  reports that she has never smoked. She has never used smokeless tobacco. She reports that she does not currently use alcohol. She reports that she does not currently use drugs.  family history includes Colon cancer in her father; Diabetes in her father.  Allergies  Allergen Reactions   Clarithromycin Anaphylaxis, Other (See Comments) and Swelling   Indomethacin Other (See Comments)   Penicillins     Unknown reaction- as a baby   Sulfonamide Derivatives     Unknown reaction- as a baby       PHYSICAL EXAMINATION: Vital signs: BP (!) 108/52   Pulse 84   Temp 97.8 F (36.6 C) (Skin)   Ht 5\' 4"  (1.626 m)   Wt 158 lb (71.7 kg)   SpO2 97%   BMI 27.12 kg/m  General: Well-developed, well-nourished, no acute distress HEENT: Sclerae are anicteric, conjunctiva pink. Oral mucosa intact Lungs: Clear Heart: Regular Abdomen: soft, nontender, nondistended, no obvious ascites, no peritoneal signs, normal bowel sounds. No organomegaly. Extremities: No edema Psychiatric: alert and oriented x3. Cooperative     ASSESSMENT:  Family history of colon cancer and personal history of adenomatous polyps   PLAN: Surveillance colonoscopy

## 2023-05-11 ENCOUNTER — Telehealth: Payer: Self-pay

## 2023-05-11 NOTE — Telephone Encounter (Signed)
Left message on follow up call. 

## 2023-07-04 ENCOUNTER — Other Ambulatory Visit: Payer: Self-pay | Admitting: Endocrinology

## 2023-07-04 DIAGNOSIS — Z1231 Encounter for screening mammogram for malignant neoplasm of breast: Secondary | ICD-10-CM

## 2023-09-15 ENCOUNTER — Ambulatory Visit
Admission: RE | Admit: 2023-09-15 | Discharge: 2023-09-15 | Disposition: A | Discharge status: Home / Self Care | Payer: 59 | Source: Ambulatory Visit | Attending: Endocrinology | Admitting: Endocrinology

## 2023-09-15 DIAGNOSIS — Z1231 Encounter for screening mammogram for malignant neoplasm of breast: Secondary | ICD-10-CM

## 2024-07-10 ENCOUNTER — Other Ambulatory Visit: Payer: Self-pay | Admitting: Endocrinology

## 2024-07-10 DIAGNOSIS — Z1231 Encounter for screening mammogram for malignant neoplasm of breast: Secondary | ICD-10-CM

## 2024-09-17 ENCOUNTER — Ambulatory Visit
